# Patient Record
Sex: Female | Born: 1985 | Race: White | Hispanic: No | Marital: Single | Smoking: Current every day smoker
Health system: Southern US, Community
[De-identification: ages and names within clinical notes are randomized; demographics above are authoritative.]

## PROBLEM LIST (undated history)

## (undated) DIAGNOSIS — R87629 Unspecified abnormal cytological findings in specimens from vagina: Secondary | ICD-10-CM

## (undated) HISTORY — PX: NO PAST SURGERIES: SHX2092

## (undated) HISTORY — DX: Unspecified abnormal cytological findings in specimens from vagina: R87.629

---
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
## 2007-02-20 ENCOUNTER — Other Ambulatory Visit: Admission: RE | Admit: 2007-02-20 | Discharge: 2007-02-20 | Payer: Self-pay | Admitting: Obstetrics and Gynecology

## 2007-08-04 ENCOUNTER — Inpatient Hospital Stay (HOSPITAL_COMMUNITY): Admission: AD | Admit: 2007-08-04 | Discharge: 2007-08-04 | Payer: Self-pay | Admitting: Obstetrics & Gynecology

## 2007-08-26 ENCOUNTER — Ambulatory Visit: Payer: Self-pay | Admitting: Advanced Practice Midwife

## 2007-08-26 ENCOUNTER — Inpatient Hospital Stay (HOSPITAL_COMMUNITY): Admission: AD | Admit: 2007-08-26 | Discharge: 2007-08-26 | Payer: Self-pay | Admitting: Family Medicine

## 2007-09-08 ENCOUNTER — Ambulatory Visit: Payer: Self-pay | Admitting: Obstetrics and Gynecology

## 2007-09-08 ENCOUNTER — Inpatient Hospital Stay (HOSPITAL_COMMUNITY): Admission: AD | Admit: 2007-09-08 | Discharge: 2007-09-08 | Payer: Self-pay | Admitting: Obstetrics & Gynecology

## 2007-09-23 ENCOUNTER — Inpatient Hospital Stay (HOSPITAL_COMMUNITY): Admission: AD | Admit: 2007-09-23 | Discharge: 2007-09-23 | Payer: Self-pay | Admitting: Obstetrics & Gynecology

## 2007-09-25 ENCOUNTER — Ambulatory Visit: Payer: Self-pay | Admitting: Family Medicine

## 2007-09-25 ENCOUNTER — Inpatient Hospital Stay (HOSPITAL_COMMUNITY): Admission: AD | Admit: 2007-09-25 | Discharge: 2007-09-25 | Payer: Self-pay | Admitting: Obstetrics & Gynecology

## 2007-09-27 ENCOUNTER — Ambulatory Visit: Payer: Self-pay | Admitting: Obstetrics & Gynecology

## 2007-09-27 ENCOUNTER — Inpatient Hospital Stay (HOSPITAL_COMMUNITY): Admission: AD | Admit: 2007-09-27 | Discharge: 2007-09-27 | Payer: Self-pay | Admitting: Obstetrics & Gynecology

## 2007-10-06 ENCOUNTER — Ambulatory Visit: Payer: Self-pay | Admitting: Obstetrics and Gynecology

## 2007-10-06 ENCOUNTER — Inpatient Hospital Stay (HOSPITAL_COMMUNITY): Admission: AD | Admit: 2007-10-06 | Discharge: 2007-10-07 | Payer: Self-pay | Admitting: Obstetrics and Gynecology

## 2009-02-06 ENCOUNTER — Emergency Department (HOSPITAL_COMMUNITY): Admission: EM | Admit: 2009-02-06 | Discharge: 2009-02-07 | Payer: Self-pay | Admitting: Emergency Medicine

## 2009-06-21 ENCOUNTER — Other Ambulatory Visit: Admission: RE | Admit: 2009-06-21 | Discharge: 2009-06-21 | Payer: Self-pay | Admitting: Unknown Physician Specialty

## 2010-02-12 ENCOUNTER — Encounter: Payer: Self-pay | Admitting: Pediatrics

## 2010-05-09 ENCOUNTER — Other Ambulatory Visit (HOSPITAL_COMMUNITY)
Admission: RE | Admit: 2010-05-09 | Discharge: 2010-05-09 | Disposition: A | Discharge status: Home / Self Care | Payer: Self-pay | Source: Ambulatory Visit | Attending: Unknown Physician Specialty | Admitting: Unknown Physician Specialty

## 2010-05-09 ENCOUNTER — Other Ambulatory Visit (HOSPITAL_COMMUNITY)
Admission: RE | Admit: 2010-05-09 | Discharge: 2010-05-09 | Disposition: A | Discharge status: Home / Self Care | Payer: Self-pay | Source: Ambulatory Visit | Attending: Nurse Practitioner | Admitting: Nurse Practitioner

## 2010-05-09 ENCOUNTER — Other Ambulatory Visit: Payer: Self-pay | Admitting: Nurse Practitioner

## 2010-05-09 DIAGNOSIS — R87610 Atypical squamous cells of undetermined significance on cytologic smear of cervix (ASC-US): Secondary | ICD-10-CM | POA: Insufficient documentation

## 2010-05-09 DIAGNOSIS — D069 Carcinoma in situ of cervix, unspecified: Secondary | ICD-10-CM | POA: Insufficient documentation

## 2010-06-09 NOTE — Op Note (Signed)
NAMEALUEL, SCHWARZ                ACCOUNT NO.:  [id number]   MEDICAL RECORD NO.:  [id number]          PATIENT TYPE:  INP   LOCATION:  A412                          FACILITY:  [facility]   PHYSICIAN:  Tilda Burrow, M.D. DATE OF BIRTH:  01/24/1985   DATE OF PROCEDURE:  04/05/2006  DATE OF DISCHARGE:                               OPERATIVE REPORT   Stacy [name] progressed slowly in labor, began to develop an excellent  contraction pattern after Pitocin was begun at about 8 a.m.  She had  spontaneous rupture mid morning.  Her cervix went from 3 cm to complete  in less than half hour.  She pushed twice and delivered over an intact  peritoneum, a healthy female infant, Apgars 9 and 9, weight is still  pending.  The amniotic fluid was clear.  There was no nuchal cord.  The  baby showed excellent respiratory activity.  The delivery was attended  by two family members.  Mother delivered under IV analgesic, did not  have an epidural, did not have any lacerations, and placenta delivered  easily, three vessel cord, cord blood gases are pending at the time of  this dictation.  Estimated blood loss 250 mL.      Tilda Burrow, M.D.  Electronically Signed     JVF/MEDQ  D:  04/05/2006  T:  04/06/2006  Job:  [id number]   cc:   Juan Quam, M.D.  Fax: [phone]   Francoise Schaumann. Milford Cage DO, FAAP  Fax: [phone]

## 2010-06-09 NOTE — H&P (Signed)
Stacy [name], Stacy [name]                ACCOUNT NO.:  [id number]   MEDICAL RECORD NO.:  [id number]           PATIENT TYPE:  OBV   LOCATION:  LDR2                          FACILITY:  [facility]   PHYSICIAN:  Tilda Burrow, M.D. DATE OF BIRTH:  Aug 10, 1985   DATE OF ADMISSION:  03/04/2006  DATE OF DISCHARGE:  02/11/2008LH                              HISTORY & PHYSICAL   DATE OF OBSERVATION ADMISSION:  March 04, 2006.   DATE OF OBSERVATION DISCHARGE:  March 04, 2006.   REASON FOR ADMISSION:  Pregnancy at 33 weeks with sharp-shooting low  abdominal pain.   MEDICAL HISTORY:  Positive for hypoglycemia.   SURGICAL HISTORY:  Negative.   ALLERGIES:  SHE HAS NO KNOWN ALLERGIES.   PRENATAL COURSE:  Uneventful.  Blood type is A positive, UDS negative.  Rubella is immune.  Hepatitis B surface antigen is negative.  HIV is  negative. HSV is negative.  Serology is nonreactive.  Pap normal.  GC  and chlamydia negative.  AFP normal.  She is a previously positive GBH  status, 28 week hemoglobin 11.0, 28 week hematocrit 33.8, 1-hour glucose  108.   PHYSICAL EXAM:  VITAL SIGNS: Stable.  Cervix is firm, closed, posterior and high.  Fetal heart rate pattern is  reactive on the strip but no uterine contractions noted x2 hours.   PLAN:  We are going to discharge her home to follow up at the office  p.r.n.      Zerita Boers, Lanier Clam      Tilda Burrow, M.D.  Electronically Signed    DL/MEDQ  D:  46/96/2952  T:  03/04/2006  Job:  [id number]

## 2010-06-09 NOTE — Group Therapy Note (Signed)
NAMEGEORJEAN, TOYA                ACCOUNT NO.:  [id number]   MEDICAL RECORD NO.:  [id number]          PATIENT TYPE:  OIB   LOCATION:  LDR2                          FACILITY:  [facility]   PHYSICIAN:  Tilda Burrow, M.D. DATE OF BIRTH:  1985-02-10   DATE OF PROCEDURE:  04/02/2006  DATE OF DISCHARGE:  04/02/2006                                 PROGRESS NOTE   Genavive came in last night about 2100 with complaints of questionable  rupture of membranes.  She said she had a gush of fluid come out x1.  She did have the same complaint about 2 weeks ago with a subsequent  negative sterile speculum exam.  She was held overnight for observation.  She did not have any more leaking.  She had an AFI done downstairs.  I  do not have the exact number but it normal per patient.   Sterile speculum exam was performed normal-appearing physiological fluid  no spurting of fluid with Valsalva.  Nitrazine negative and fern  negative.  Cervix--the outer os is about 4 cm.  Her cervix is thick; and  the inner office is 2 cm at -2 station vertex presentation. At the  present time we are putting the patient on the monitor to obtain a  reactive nonstress test before sending her home.      Jacklyn Shell, C.N.M.      Tilda Burrow, M.D.  Electronically Signed    FC/MEDQ  D:  04/02/2006  T:  04/02/2006  Job:  [id number]

## 2010-06-09 NOTE — H&P (Signed)
NAMEMAKALYN, LENNOX                ACCOUNT NO.:  [id number]   MEDICAL RECORD NO.:  [id number]          PATIENT TYPE:  INP   LOCATION:  LDR1                           FACILITY:   PHYSICIAN:  Tilda Burrow, M.D. DATE OF BIRTH:  [birth year]   DATE OF ADMISSION:  04/05/2006  DATE OF DISCHARGE:  LH                              HISTORY & PHYSICAL   REASON FOR ADMISSION:  Pregnancy at 38 weeks, elective induction at the  request of both patient and significant other.   PAST MEDICAL HISTORY:  Positive for hypoglycemia.   PAST SURGICAL HISTORY:  Negative.   ALLERGIES:  No known drug allergies.   MEDICATIONS:  Prenatal vitamins.   FAMILY HISTORY:  Benign.   PRENATAL COURSE:  Uneventful up to this point. Blood type is A positive.  VDS negative. Rubella immune. Hepatitis B surface antigen negative. HIV  is negative. HSV is negative. Serology is nonreactive. Pap normal.  Repeat GC/Chlamydia negative. AFP normal. GBS is a prior positive, she  will need treatment in labor.  A 28-week hemoglobin is 11, 28 week hematocrit 33.8. One hour glucose  was 101.   PHYSICAL EXAMINATION:  VITAL SIGNS: Weight is 169, blood pressure  126/64. Fundal height is 37 cm. Fetal heart rate is 130, strong and  regular.  PELVIC: Cervix is 2 cm, about 60% effaced, -2 station.   After a lengthy discussion with Robertha Marcelino Duster and her significant  other, they opt for elective induction due to the fact he is to ship out  to [place] at the end of the week and would like to see his infant son.  I  discussed with them thoroughly the risk of induction at 38 weeks, the  risk of premature infant having respiratory distress syndrome; prolonged  hospital stay, threat to the infant's well being due to elective  induction. Also discussed the increased risk  of cesarean section with both of them.  They both verbalized their  understanding of this and still desire elective induction.  She is to  present to the hospital on  Thursday evening for Foley bulb induction of  labor.  I discussed plan of care with Dr. Emelda Fear and he is in  agreement with plan of care.      Zerita Boers, Lanier Clam      Tilda Burrow, M.D.  Electronically Signed    DL/MEDQ  D:  16/10/9602  T:  04/04/2006  Job:  [id number]   cc:   Francoise Schaumann. Milford Cage DO, FAAP  Fax: [phone]

## 2010-06-09 NOTE — Consult Note (Signed)
Stacy [name], Stacy [name]                ACCOUNT NO.:  [id number]   MEDICAL RECORD NO.:  [id number]          PATIENT TYPE:  OIB   LOCATION:  LDR2                          FACILITY:  [facility]   PHYSICIAN:  Lazaro Arms, M.D.   DATE OF BIRTH:  Mar 31, 1985   DATE OF CONSULTATION:  03/30/2006  DATE OF DISCHARGE:  03/30/2006                                 CONSULTATION   LABOR AND DELIVERY OBSERVATION NOTE:  Stacy [name] is an 25 year old white  female gravida 2 para 1 estimated date of delivery April 17, 2006 at 29-  1/[redacted] weeks gestation who presented to labor and delivery complaining of  regular uterine contractions.  She was kept on the monitor for 2-1/2  hours.  She was 2 cm  when she came in and had not had any change over  that time.  She is contracting about every 10-20 minutes irregularly.  No rupture of membranes.  Fetal heart rate tracing is reactive, no  bleeding.   As a result, she is rule out labor and she appears to be false labor, so  she is discharged to home to follow up as scheduled or if labor  intensifies.      Lazaro Arms, M.D.  Electronically Signed     LHE/MEDQ  D:  03/30/2006  T:  03/30/2006  Job:  [id number]

## 2010-06-09 NOTE — H&P (Signed)
NAMENGUYEN, BUTLER                ACCOUNT NO.:  [id number]   MEDICAL RECORD NO.:  [id number]          PATIENT TYPE:  OIB   LOCATION:  LDR2                          FACILITY:  [facility]   PHYSICIAN:  Tilda Burrow, M.D. DATE OF BIRTH:  1985/01/24   DATE OF ADMISSION:  04/01/2006  DATE OF DISCHARGE:  03/11/2008LH                              HISTORY & PHYSICAL   REASON FOR ADMISSION:  Pregnancy at 38 weeks with questionable leaking  of membranes.  Raye notified the office last night of feeling wet and  concerned that her water might be leaking.  She was advised to come to  the hospital.  During the course of the night there has been no active  leaking noted, no uterine contractions.  Fetal heart rate is strong and  regular and not having any problems.   MEDICAL HISTORY:  Positive for hypoglycemia.   SURGICAL HISTORY:  Negative.   ALLERGIES:  NO KNOWN ALLERGIES.   LABORATORY DATA:  Blood type is A+, UDS is negative, Rubella is immune,  Hepatitis B surface is negative, HIV is negative, HSV is negative.  Serology is nonreactive, PAP normal, GC and chlamydia are negative.  She  is prior GBS positive.  hemoglobin 11, hematocrit 33.8, one-hour glucose  is 108.   PHYSICAL EXAMINATION:  Vital signs are stable.  There has been no active  leaking.  Fetal heart rate is stable.   PLAN:  We are going do a sterile spec exam and an ultrasound for AFI.      Zerita Boers, Lanier Clam      Tilda Burrow, M.D.  Electronically Signed    DL/MEDQ  D:  57/84/6962  T:  04/02/2006  Job:  [id number]   cc:   Tilda Burrow, M.D.  Fax: [phone]

## 2010-06-09 NOTE — Op Note (Signed)
NAMEIZABELA, OW                ACCOUNT NO.:  [id number]   MEDICAL RECORD NO.:  [id number]          PATIENT TYPE:  INP   LOCATION:  LDR2                          FACILITY:  [facility]   PHYSICIAN:  Tilda Burrow, M.D. DATE OF BIRTH:  14-Jun-1985   DATE OF PROCEDURE:  DATE OF DISCHARGE:                                  PROCEDURE NOTE   DELIVERY SUMMARY   ONSET OF LABOR:  0800 August 09, 2004.   DATE OF DELIVERY:  1637 August 09, 2004.   LENGTH OF FIRST STAGE LABOR:  8 hours 15 minutes.   LENGTH OF SECOND STAGE LABOR:  22 minutes.   LENGTH OF THIRD STAGE LABOR:  3 minutes  .   Patient had a normal spontaneous vaginal delivery of a viable female infant.  Apgars 9 and 9 over an intact perineum upon delivery of head.  Shoulders  were spontaneously rotated without any difficulty and infant easily  delivered.  Upon delivery infant had strong cry, good movement of all  extremities and pinked up well.  Cord blood gas was obtained.  Third stage  of labor was actually managed with 20 units Pitocin in 1000 cc of D5-LR at a  rapid rate.  Estimated blood loss approximately 300 cc Hemabate 0.5 amp IM  was given prophylactically due to tendency for uterine relaxation.  Placenta  was delivered spontaneously via Schultze mechanism.  A three-vessel cord was  noted upon inspection, and membranes were noted to be intact __________ .       DL/MEDQ  D:  16/10/9602  T:  08/09/2004  Job:  [id number]

## 2010-06-09 NOTE — H&P (Signed)
Stacy [name], Stacy [name]                ACCOUNT NO.:  [id number]   MEDICAL RECORD NO.:  [id number]          PATIENT TYPE:  OIB   LOCATION:  LDR2                          FACILITY:  [facility]   PHYSICIAN:  Lazaro Arms, M.D.   DATE OF BIRTH:  01/11/1986   DATE OF ADMISSION:  DATE OF DISCHARGE:  LH                                HISTORY & PHYSICAL   HISTORY OF PRESENT ILLNESS:  Stacy [name] is a 25 year old white female gravida  1, para 0, estimated date of delivery of August 23, 2004 at 37-6/[redacted] weeks  gestation who came into labor and delivery last night complaining of regular  uterine contractions. She was having contractions every 2 to 4 minutes and  was 1 cm.  We kept her overnight for observation and this morning her cervix  is 3 to 4 cm, 80% effaced and minus 1 to minus 2 station vertex.  Amniotomy  was performed and reveals clear fluid.  Prenatal care has been somewhat  spotty and she had an almost 2 month lapse in care from April to June and  she is also significant for having a restraining order placed on the father  of the baby but those issues seem to have been cleared up.   PAST MEDICAL HISTORY:  Negative.   PAST SURGICAL HISTORY:  Negative.   OBSTETRICAL HISTORY:  She is nulliparous.   ALLERGIES:  None.   MEDICATIONS:  Prenatal vitamins and iron.   LABORATORY DATA:  Blood type is 0 positive.  She was positive on drug screen  for marijuana.  Rubella is immune.  Hepatitis B was negative.  HIV was  nonreactive.  Serology was nonreactive X2.  Pap smear was normal.  GC and  Chlamydia negative x2.  Her Group B Strep is positive.  I do not have a  Glucola reported on my prenatal record.  We will track that down.   PHYSICAL EXAMINATION:  HEENT:  Unremarkable.  NECK:  Thyroid is normal.  LUNGS:  Clear.  HEART:  Regular rate and rhythm without murmurs, rubs or gallops.  BREASTS:  Without masses, discharge or skin changes.  ABDOMEN:  Last fundal height in office was 37 cm.  PELVIC:   Cervix as above 3-4 cm.  80% effaced, minus 1 to 2 station, vertex,  soft, mid plane.  Amniotomy was performed with clear fluid.  EXTREMITIES:  Warm with no edema.  NEUROLOGICAL:  Grossly intact.   IMPRESSION:  1.  Intrauterine pregnancy at 37-6/[redacted] weeks gestation.  2.  Early labor.   PLAN:  Patient is admitted for labor management with expectation of NSVD.       LHE/MEDQ  D:  08/09/2004  T:  08/09/2004  Job:  [id number]

## 2010-06-09 NOTE — Op Note (Signed)
[name], [name]                ACCOUNT NO.:  [id number]   MEDICAL RECORD NO.:  [id number]          PATIENT TYPE:  OIB   LOCATION:  A415                          FACILITY:  [facility]   PHYSICIAN:  Stacy [name], M.D. DATE OF BIRTH:  1985/06/11   DATE OF PROCEDURE:  DATE OF DISCHARGE:                                 OPERATIVE REPORT   CHIEF COMPLAINT:  Low back pain, right greater than left.   A 25 year old female, approximately [redacted] weeks gestation, presents to labor  and delivery with third day of low back pain, worse today.  The patient has  had no bleeding, spotting, discharge, abnormalities.  She is afebrile.   Urinalysis was 2+ leukocytes, negative protein.   PHYSICAL EXAMINATION:  GENERAL:  A pale Caucasian female in late pregnancy  gestation.  Pretibial edema 2+.  VITAL SIGNS:  Blood pressures are within normal limits by verbal report.  The last blood pressure noted at 140/83.  MUSCULOSKELETAL:  Exam shows 2+ reflexes.  Negative CVA tenderness.  No  suprapubic tenderness.  PELVIC:  Cervical exam by RN shows cervix to be essentially closed.   PLAN:  Treat for possible UTI (patient refuses cathed UA, so we will treat  her for possible UTI, follow-up culture and sensitivity next week).   Observation x1 hour.       JVF/MEDQ  D:  07/20/2004  T:  07/20/2004  Job:  [id number]

## 2010-10-19 LAB — WET PREP, GENITAL: Yeast Wet Prep HPF POC: NONE SEEN

## 2010-10-25 LAB — RPR: RPR Ser Ql: NONREACTIVE

## 2010-10-25 LAB — CBC
HCT: 27.2 — ABNORMAL LOW
MCV: 81.4
Platelets: 205
RBC: 3.34 — ABNORMAL LOW
WBC: 8.2

## 2013-12-18 ENCOUNTER — Emergency Department (HOSPITAL_BASED_OUTPATIENT_CLINIC_OR_DEPARTMENT_OTHER): Payer: Self-pay

## 2013-12-18 ENCOUNTER — Emergency Department (HOSPITAL_BASED_OUTPATIENT_CLINIC_OR_DEPARTMENT_OTHER)
Admission: EM | Admit: 2013-12-18 | Discharge: 2013-12-18 | Disposition: A | Discharge status: Home / Self Care | Payer: Self-pay | Attending: Emergency Medicine | Admitting: Emergency Medicine

## 2013-12-18 ENCOUNTER — Encounter (HOSPITAL_BASED_OUTPATIENT_CLINIC_OR_DEPARTMENT_OTHER): Payer: Self-pay

## 2013-12-18 DIAGNOSIS — M25551 Pain in right hip: Secondary | ICD-10-CM | POA: Insufficient documentation

## 2013-12-18 DIAGNOSIS — R52 Pain, unspecified: Secondary | ICD-10-CM

## 2013-12-18 DIAGNOSIS — Z720 Tobacco use: Secondary | ICD-10-CM | POA: Insufficient documentation

## 2013-12-18 DIAGNOSIS — M5441 Lumbago with sciatica, right side: Secondary | ICD-10-CM | POA: Insufficient documentation

## 2013-12-18 MED ORDER — CYCLOBENZAPRINE HCL 10 MG PO TABS
5.0000 mg | ORAL_TABLET | Freq: Once | ORAL | Status: AC
  Administered 2013-12-18: 5 mg via ORAL
  Filled 2013-12-18: qty 1

## 2013-12-18 MED ORDER — IBUPROFEN 800 MG PO TABS
800.0000 mg | ORAL_TABLET | Freq: Once | ORAL | Status: AC
  Administered 2013-12-18: 800 mg via ORAL
  Filled 2013-12-18: qty 1

## 2013-12-18 MED ORDER — OXYCODONE-ACETAMINOPHEN 5-325 MG PO TABS
2.0000 | ORAL_TABLET | Freq: Once | ORAL | Status: AC
  Administered 2013-12-18: 2 via ORAL
  Filled 2013-12-18: qty 2

## 2013-12-18 NOTE — ED Notes (Signed)
C/o right lower back, right hip and thigh pain x .5 weeks-denies injury

## 2013-12-18 NOTE — Discharge Instructions (Signed)
Please follow up with your doctor Monday if not better.

## 2013-12-18 NOTE — ED Provider Notes (Signed)
CSN: [id number]     Arrival date & time 12/18/13  1526 History  This chart was scribed for Hilario Quarryanielle S Sahej Hauswirth, MD by Julian HyMorgan Graham, ED Scribe. The patient was seen in MHT13/MHT13. The patient's care was started at 7:46 PM.    Chief Complaint  Patient presents with  . Back Pain    Patient is a 28 y.o. female presenting with back pain. The history is provided by the patient. No language interpreter was used.  Back Pain Location:  Gluteal region Quality:  Unable to specify Radiates to:  R thigh Pain severity:  Moderate Pain is:  Same all the time Onset quality:  Gradual Duration:  3 days Timing:  Constant Progression:  Worsening Chronicity:  New Context: not falling, not recent illness and not recent injury   Relieved by:  Nothing Worsened by:  Ambulation, lying down, sitting, touching, standing and movement Ineffective treatments:  Ibuprofen Associated symptoms: no abdominal pain, no dysuria, no fever, no numbness, no tingling and no weakness    HPI Comments: Stacy Long is a 28 y.o. female who presents to the Emergency Department complaining of new, moderate, constant, and gradually worsening lower back pain onset 3 day ago. Pt has associated right leg pain localizing to the right thigh and groin. She denies fall, trauma or injury to the area. Pt states she has needed assistance to ambulate. She attempted to alleviate her pain with tylenol with no relief. She has also attempted to stretch with no relief. Her pain is worsened with ROM, ambulation, laying down and sitting. She denies taking medications regularly. Pt denies allergies. Pt is a smoker. Pt denies numbness, weakness, fevers, dysuria, frequency, urgency or hematuria.   LMP: November 6-7 which was a normal period at a normal time.  History reviewed. No pertinent past medical history. History reviewed. No pertinent past surgical history. No family history on file. History  Substance Use Topics  . Smoking status: Current Every  Day Smoker  . Smokeless tobacco: Not on file  . Alcohol Use: No   OB History    No data available     Review of Systems  Constitutional: Negative for fever and chills.  Gastrointestinal: Negative for nausea, vomiting and abdominal pain.  Genitourinary: Negative for dysuria, urgency, frequency and hematuria.  Musculoskeletal: Positive for back pain.  Neurological: Negative for tingling, weakness and numbness.  All other systems reviewed and are negative.  Allergies  Review of patient's allergies indicates no known allergies.  Home Medications   Prior to Admission medications   Not on File   Triage Vitals: BP 106/57 mmHg  Pulse 71  Temp(Src) 98.2 F (36.8 C) (Oral)  Resp 16  Ht 5\' 5"  (1.651 m)  Wt 130 lb (58.968 kg)  BMI 21.63 kg/m2  SpO2 100%  LMP 12/07/2013  Physical Exam  Constitutional: She is oriented to person, place, and time. She appears well-developed and well-nourished.  HENT:  Head: Normocephalic and atraumatic.  Right Ear: External ear normal.  Left Ear: External ear normal.  Nose: Nose normal.  Mouth/Throat: Oropharynx is clear and moist.  Eyes: Conjunctivae and EOM are normal. Pupils are equal, round, and reactive to light.  Neck: Normal range of motion. Neck supple.  Cardiovascular: Normal rate, regular rhythm, normal heart sounds and intact distal pulses.   Pulmonary/Chest: Effort normal and breath sounds normal.  Abdominal: Soft. Bowel sounds are normal.  Musculoskeletal: Normal range of motion.  Neurological: She is alert and oriented to person, place, and time. She  has normal reflexes. She displays normal reflexes. No cranial nerve deficit. She exhibits normal muscle tone. Coordination normal.  Normal ambulation  Skin: Skin is warm and dry.  Psychiatric: She has a normal mood and affect. Her behavior is normal. Judgment and thought content normal.  Nursing note and vitals reviewed.   ED Course  Procedures (including critical care  time) DIAGNOSTIC STUDIES: Oxygen Saturation is 100% on RA, normal by my interpretation.    COORDINATION OF CARE: 7:51 PM- X-Addie Alonge of lower back and right leg. Patient informed of current plan for treatment and evaluation and agrees with plan at this time.  Imaging Review Dg Lumbar Spine Complete  12/18/2013   CLINICAL DATA:  Right lateral back pain, right hip pain for 1 week, no known injury  EXAM: LUMBAR SPINE - COMPLETE 4+ VIEW  COMPARISON:  None.  FINDINGS: Five views of lumbar spine submitted. No acute fracture or subluxation. Alignment, disc spaces and vertebral body heights are preserved.  IMPRESSION: No acute fracture or subluxation.  Normal alignment.   Electronically Signed   By: Natasha MeadLiviu  Pop M.D.   On: 12/18/2013 20:34   Dg Hip Complete Right  12/18/2013   CLINICAL DATA:  Right lateral back pain, right hip pain for 1 week, no known injury  EXAM: RIGHT HIP - COMPLETE 2+ VIEW  COMPARISON:  None.  FINDINGS: Three views of the right hip submitted. No acute fracture or subluxation. Bilateral hip joints are symmetrical in appearance.  IMPRESSION: Negative.   Electronically Signed   By: Natasha MeadLiviu  Pop M.D.   On: 12/18/2013 20:33    MDM   Final diagnoses:  Pain  Right-sided low back pain with right-sided sciatica  Right hip pain    I personally performed the services described in this documentation, which was scribed in my presence. The recorded information has been reviewed and considered.      Hilario Quarryanielle S Tephanie Escorcia, MD 12/18/13 [phone]

## 2014-07-30 ENCOUNTER — Encounter (HOSPITAL_COMMUNITY): Payer: Self-pay

## 2014-07-30 ENCOUNTER — Emergency Department (HOSPITAL_COMMUNITY)
Admission: EM | Admit: 2014-07-30 | Discharge: 2014-07-30 | Disposition: A | Discharge status: Home / Self Care | Payer: Self-pay | Attending: Emergency Medicine | Admitting: Emergency Medicine

## 2014-07-30 ENCOUNTER — Emergency Department (HOSPITAL_COMMUNITY): Payer: Self-pay

## 2014-07-30 DIAGNOSIS — Y9389 Activity, other specified: Secondary | ICD-10-CM | POA: Insufficient documentation

## 2014-07-30 DIAGNOSIS — Z720 Tobacco use: Secondary | ICD-10-CM | POA: Insufficient documentation

## 2014-07-30 DIAGNOSIS — S0511XA Contusion of eyeball and orbital tissues, right eye, initial encounter: Secondary | ICD-10-CM

## 2014-07-30 DIAGNOSIS — S60222A Contusion of left hand, initial encounter: Secondary | ICD-10-CM | POA: Insufficient documentation

## 2014-07-30 DIAGNOSIS — Y998 Other external cause status: Secondary | ICD-10-CM | POA: Insufficient documentation

## 2014-07-30 DIAGNOSIS — S0990XA Unspecified injury of head, initial encounter: Secondary | ICD-10-CM | POA: Insufficient documentation

## 2014-07-30 DIAGNOSIS — S0011XA Contusion of right eyelid and periocular area, initial encounter: Secondary | ICD-10-CM | POA: Insufficient documentation

## 2014-07-30 DIAGNOSIS — Y929 Unspecified place or not applicable: Secondary | ICD-10-CM | POA: Insufficient documentation

## 2014-07-30 DIAGNOSIS — W109XXA Fall (on) (from) unspecified stairs and steps, initial encounter: Secondary | ICD-10-CM | POA: Insufficient documentation

## 2014-07-30 MED ORDER — IBUPROFEN 400 MG PO TABS
600.0000 mg | ORAL_TABLET | Freq: Once | ORAL | Status: AC
  Administered 2014-07-30: 600 mg via ORAL
  Filled 2014-07-30: qty 2

## 2014-07-30 NOTE — Discharge Instructions (Signed)
Contusion A contusion is a deep bruise. Contusions are the result of an injury that caused bleeding under the skin. The contusion may turn blue, purple, or yellow. Minor injuries will give you a painless contusion, but more severe contusions may stay painful and swollen for a few weeks.  CAUSES  A contusion is usually caused by a blow, trauma, or direct force to an area of the body. SYMPTOMS   Swelling and redness of the injured area.  Bruising of the injured area.  Tenderness and soreness of the injured area.  Pain. DIAGNOSIS  The diagnosis can be made by taking a history and physical exam. An X-ray, CT scan, or MRI may be needed to determine if there were any associated injuries, such as fractures. TREATMENT  Specific treatment will depend on what area of the body was injured. In general, the best treatment for a contusion is resting, icing, elevating, and applying cold compresses to the injured area. Over-the-counter medicines may also be recommended for pain control. Ask your caregiver what the best treatment is for your contusion. HOME CARE INSTRUCTIONS   Put ice on the injured area.  Put ice in a plastic bag.  Place a towel between your skin and the bag.  Leave the ice on for 15-20 minutes, 3-4 times a day, or as directed by your health care provider.  Only take over-the-counter or prescription medicines for pain, discomfort, or fever as directed by your caregiver. Your caregiver may recommend avoiding anti-inflammatory medicines (aspirin, ibuprofen, and naproxen) for 48 hours because these medicines may increase bruising.  Rest the injured area.  If possible, elevate the injured area to reduce swelling. SEEK IMMEDIATE MEDICAL CARE IF:   You have increased bruising or swelling.  You have pain that is getting worse.  Your swelling or pain is not relieved with medicines. MAKE SURE YOU:   Understand these instructions.  Will watch your condition.  Will get help right  away if you are not doing well or get worse. Document Released: 10/18/2004 Document Revised: 01/13/2013 Document Reviewed: 11/13/2010 [facility] Patient Information 2015 [employer], [place]. This information is not intended to replace advice given to you by your health care provider. Make sure you discuss any questions you have with your health care provider.   You have had a head injury which does not appear to require admission at this time. A concussion is a state of changed mental ability from trauma.  SEEK IMMEDIATE MEDICAL ATTENTION IF: There is confusion or drowsiness (although children frequently become drowsy after injury).  You cannot awaken the injured person.  There is nausea (feeling sick to your stomach) or continued, forceful vomiting.  You notice dizziness or unsteadiness which is getting worse, or inability to walk.  You have convulsions or unconsciousness.  You experience severe, persistent headaches not relieved by Tylenol. (Do not take aspirin as this impairs clotting abilities). Take other pain medications only as directed.  You cannot use arms or legs normally.  There are changes in pupil sizes. (This is the black center in the colored part of the eye)  There is clear or bloody discharge from the nose or ears.  Change in speech, vision, swallowing, or understanding.  Localized weakness, numbness, tingling, or change in bowel or bladder control.
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
## 2014-07-30 NOTE — ED Notes (Signed)
Pt fell down approx 4 steps this morning, states she injured her left hand and has swelling to below right eye.  Pt denies loc

## 2014-12-29 ENCOUNTER — Encounter (HOSPITAL_COMMUNITY): Payer: Self-pay | Admitting: Emergency Medicine

## 2014-12-29 ENCOUNTER — Emergency Department (HOSPITAL_COMMUNITY)
Admission: EM | Admit: 2014-12-29 | Discharge: 2014-12-30 | Disposition: A | Discharge status: Home / Self Care | Payer: Self-pay | Attending: Emergency Medicine | Admitting: Emergency Medicine

## 2014-12-29 DIAGNOSIS — W25XXXA Contact with sharp glass, initial encounter: Secondary | ICD-10-CM | POA: Insufficient documentation

## 2014-12-29 DIAGNOSIS — R000 Tachycardia, unspecified: Secondary | ICD-10-CM | POA: Insufficient documentation

## 2014-12-29 DIAGNOSIS — Y9389 Activity, other specified: Secondary | ICD-10-CM | POA: Insufficient documentation

## 2014-12-29 DIAGNOSIS — F17200 Nicotine dependence, unspecified, uncomplicated: Secondary | ICD-10-CM | POA: Insufficient documentation

## 2014-12-29 DIAGNOSIS — Z23 Encounter for immunization: Secondary | ICD-10-CM | POA: Insufficient documentation

## 2014-12-29 DIAGNOSIS — Y998 Other external cause status: Secondary | ICD-10-CM | POA: Insufficient documentation

## 2014-12-29 DIAGNOSIS — Y9289 Other specified places as the place of occurrence of the external cause: Secondary | ICD-10-CM | POA: Insufficient documentation

## 2014-12-29 DIAGNOSIS — S91312A Laceration without foreign body, left foot, initial encounter: Secondary | ICD-10-CM | POA: Insufficient documentation
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
## 2014-12-30 MED ORDER — POVIDONE-IODINE 10 % EX SOLN
CUTANEOUS | Status: AC
  Administered 2014-12-30: 01:00:00
  Filled 2014-12-30: qty 118

## 2014-12-30 MED ORDER — TETANUS-DIPHTH-ACELL PERTUSSIS 5-2.5-18.5 LF-MCG/0.5 IM SUSP
0.5000 mL | Freq: Once | INTRAMUSCULAR | Status: AC
  Administered 2014-12-30: 0.5 mL via INTRAMUSCULAR
  Filled 2014-12-30: qty 0.5

## 2014-12-30 MED ORDER — LIDOCAINE HCL (PF) 1 % IJ SOLN
5.0000 mL | Freq: Once | INTRAMUSCULAR | Status: AC
  Administered 2014-12-30: 5 mL
  Filled 2014-12-30: qty 5

## 2014-12-30 MED ORDER — BACITRACIN-NEOMYCIN-POLYMYXIN 400-5-5000 EX OINT
TOPICAL_OINTMENT | Freq: Once | CUTANEOUS | Status: AC
  Administered 2014-12-30: 1 via TOPICAL
  Filled 2014-12-30: qty 1

## 2014-12-30 NOTE — Discharge Instructions (Signed)
Follow up in 7 days for suture removal with your doctor. Return here as needed for any signs of infection.

## 2014-12-30 NOTE — ED Notes (Signed)
Non stick dressing applied to patients left foot

## 2015-02-10 ENCOUNTER — Emergency Department (HOSPITAL_COMMUNITY)
Admission: EM | Admit: 2015-02-10 | Discharge: 2015-02-10 | Disposition: A | Discharge status: Home / Self Care | Payer: Self-pay | Attending: Emergency Medicine | Admitting: Emergency Medicine

## 2015-02-10 ENCOUNTER — Encounter (HOSPITAL_COMMUNITY): Payer: Self-pay | Admitting: Emergency Medicine

## 2015-02-10 DIAGNOSIS — F17200 Nicotine dependence, unspecified, uncomplicated: Secondary | ICD-10-CM | POA: Insufficient documentation

## 2015-02-10 DIAGNOSIS — K047 Periapical abscess without sinus: Secondary | ICD-10-CM | POA: Insufficient documentation

## 2015-02-10 DIAGNOSIS — K029 Dental caries, unspecified: Secondary | ICD-10-CM | POA: Insufficient documentation

## 2015-02-10 MED ORDER — AMOXICILLIN 500 MG PO CAPS: 500.0000 mg | ORAL_CAPSULE | Freq: Three times a day (TID) | ORAL | Status: AC

## 2015-02-10 MED ORDER — HYDROCODONE-ACETAMINOPHEN 5-325 MG PO TABS: 1.0000 | ORAL_TABLET | ORAL | Status: DC | PRN

## 2015-02-10 MED ORDER — HYDROCODONE-ACETAMINOPHEN 5-325 MG PO TABS
1.0000 | ORAL_TABLET | Freq: Once | ORAL | Status: AC
  Administered 2015-02-10: 1 via ORAL
  Filled 2015-02-10: qty 1

## 2015-02-10 MED ORDER — AMOXICILLIN 250 MG PO CAPS
500.0000 mg | ORAL_CAPSULE | Freq: Once | ORAL | Status: AC
  Administered 2015-02-10: 500 mg via ORAL
  Filled 2015-02-10: qty 2

## 2015-02-10 NOTE — Discharge Instructions (Signed)
Dental Abscess °A dental abscess is a collection of pus in or around a tooth. °CAUSES °This condition is caused by a bacterial infection around the root of the tooth that involves the inner part of the tooth (pulp). It may result from: °· Severe tooth decay. °· Trauma to the tooth that allows bacteria to enter into the pulp, such as a broken or chipped tooth. °· Severe gum disease around a tooth. °SYMPTOMS °Symptoms of this condition include: °· Severe pain in and around the infected tooth. °· Swelling and redness around the infected tooth, in the mouth, or in the face. °· Tenderness. °· Pus drainage. °· Bad breath. °· Bitter taste in the mouth. °· Difficulty swallowing. °· Difficulty opening the mouth. °· Nausea. °· Vomiting. °· Chills. °· Swollen neck glands. °· Fever. °DIAGNOSIS °This condition is diagnosed with examination of the infected tooth. During the exam, your dentist may tap on the infected tooth. Your dentist will also ask about your medical and dental history and may order X-rays. °TREATMENT °This condition is treated by eliminating the infection. This may be done with: °· Antibiotic medicine. °· A root canal. This may be performed to save the tooth. °· Pulling (extracting) the tooth. This may also involve draining the abscess. This is done if the tooth cannot be saved. °HOME CARE INSTRUCTIONS °· Take medicines only as directed by your dentist. °· If you were prescribed antibiotic medicine, finish all of it even if you start to feel better. °· Rinse your mouth (gargle) often with salt water to relieve pain or swelling. °· Do not drive or operate heavy machinery while taking pain medicine. °· Do not apply heat to the outside of your mouth. °· Keep all follow-up visits as directed by your dentist. This is important. °SEEK MEDICAL CARE IF: °· Your pain is worse and is not helped by medicine. °SEEK IMMEDIATE MEDICAL CARE IF: °· You have a fever or chills. °· Your symptoms suddenly get worse. °· You have a  very bad headache. °· You have problems breathing or swallowing. °· You have trouble opening your mouth. °· You have swelling in your neck or around your eye. °  °This information is not intended to replace advice given to you by your health care provider. Make sure you discuss any questions you have with your health care provider. °  °Document Released: 01/08/2005 Document Revised: 05/25/2014 Document Reviewed: 01/05/2014 °Elsevier Interactive Patient Education ©2016 [employer] ° ° °Complete your entire course of antibiotics as prescribed.  You  may use the hydrocodone for pain relief but do not drive within 4 hours of taking as this will make you drowsy.  Avoid applying heat or ice to this abscess area which can worsen your symptoms.  You may use warm salt water swish and spit treatment or half peroxide and water swish and spit after meals to keep this area clean as discussed.  Call the dentist listed above for further management of your symptoms. ° ° ° °

## 2015-02-10 NOTE — ED Provider Notes (Signed)
CSN: [id number]     Arrival date & time 02/10/15  1350 History   First MD Initiated Contact with Patient 02/10/15 1439     Chief Complaint  Patient presents with  . Oral Swelling     (Consider location/radiation/quality/duration/timing/severity/associated sxs/prior Treatment) The history is provided by the patient.   Stacy Long is a 30 y.o. female presenting with a 2 day history of dental pain and gingival swelling.   The patient has a history of injury and/or decay in the tooth involved which has recently started to cause increased  pain.  There has been no fevers, chills, nausea or vomiting, also no complaint of difficulty swallowing, although chewing makes pain worse.  The patient has tried orajel without relief of symptoms.        History reviewed. No pertinent past medical history. History reviewed. No pertinent past surgical history. History reviewed. No pertinent family history. Social History  Substance Use Topics  . Smoking status: Current Every Day Smoker -- 1.00 packs/day  . Smokeless tobacco: None  . Alcohol Use: No   OB History    No data available     Review of Systems  Constitutional: Negative for fever and chills.  HENT: Positive for dental problem and facial swelling. Negative for sore throat.   Respiratory: Negative for shortness of breath.   Musculoskeletal: Negative for neck pain and neck stiffness.      Allergies  Codeine and Pseudoeph-doxylamine-dm-apap  Home Medications   Prior to Admission medications   Medication Sig Start Date End Date Taking? Authorizing Provider  benzocaine (ORAJEL) 10 % mucosal gel Use as directed 1 application in the mouth or throat as needed for mouth pain.   Yes Historical Provider, MD  Iron-Vitamins (GERITOL COMPLETE PO) Take 1 tablet by mouth daily.   Yes Historical Provider, MD  amoxicillin (AMOXIL) 500 MG capsule Take 1 capsule (500 mg total) by mouth 3 (three) times daily. 02/10/15 02/20/15  Burgess Amor, PA-C    HYDROcodone-acetaminophen (NORCO/VICODIN) 5-325 MG tablet Take 1 tablet by mouth every 4 (four) hours as needed. 02/10/15   Burgess Amor, PA-C   BP 119/75 mmHg  Pulse 87  Temp(Src) 98.6 F (37 C) (Oral)  Resp 18  Ht  (1.651 m)  Wt 58.968 kg  BMI 21.63 kg/m2  SpO2 99%  LMP 01/23/2015 Physical Exam  Constitutional: She is oriented to person, place, and time. She appears well-developed and well-nourished. No distress.  HENT:  Head: Normocephalic and atraumatic.  Right Ear: Tympanic membrane and external ear normal.  Left Ear: Tympanic membrane and external ear normal.  Mouth/Throat: Oropharynx is clear and moist and mucous membranes are normal. No oral lesions. No trismus in the jaw. Dental abscesses present.    Generalized dental decay.  No purulent drainage.  Eyes: Conjunctivae are normal.  Neck: Normal range of motion. Neck supple.  Cardiovascular: Normal rate.   Pulmonary/Chest: Effort normal.  Lymphadenopathy:    She has no cervical adenopathy.  Neurological: She is alert and oriented to person, place, and time.  Skin: Skin is warm and dry. No erythema.  Psychiatric: She has a normal mood and affect.    ED Course  Procedures (including critical care time) Labs Review Labs Reviewed - No data to display  Imaging Review No results found. I have personally reviewed and evaluated these images and lab results as part of my medical decision-making.   EKG Interpretation None      MDM   Final diagnoses:  Dental  abscess    Patient with dental infection with moderate dental decay, no drainable abscess appreciated.  She was prescribed amoxicillin and hydrocodone.  First dose is given here, mother driving home.  Dental referrals given.  When necessary follow-up anticipated.  The patient appears reasonably screened and/or stabilized for discharge and I doubt any other medical condition or other [facility] requiring further screening, evaluation, or treatment in the ED at this  time prior to discharge.     Burgess Amor, PA-C 02/10/15 1557  Rolland Porter, MD 02/15/15 [phone]

## 2015-02-10 NOTE — ED Notes (Signed)
Pt states that she has been having left sided dental pain with swelling.

## 2016-01-12 IMAGING — DX DG HAND COMPLETE 3+V*L*
3 series · 3 of 3 positions shown · non-contrast
Comparison: None.

CLINICAL DATA: Left hand pain to the radial side and thumb. Fall
this morning. Initial encounter.

EXAM:
LEFT HAND - COMPLETE 3+ VIEW

[hand pa]
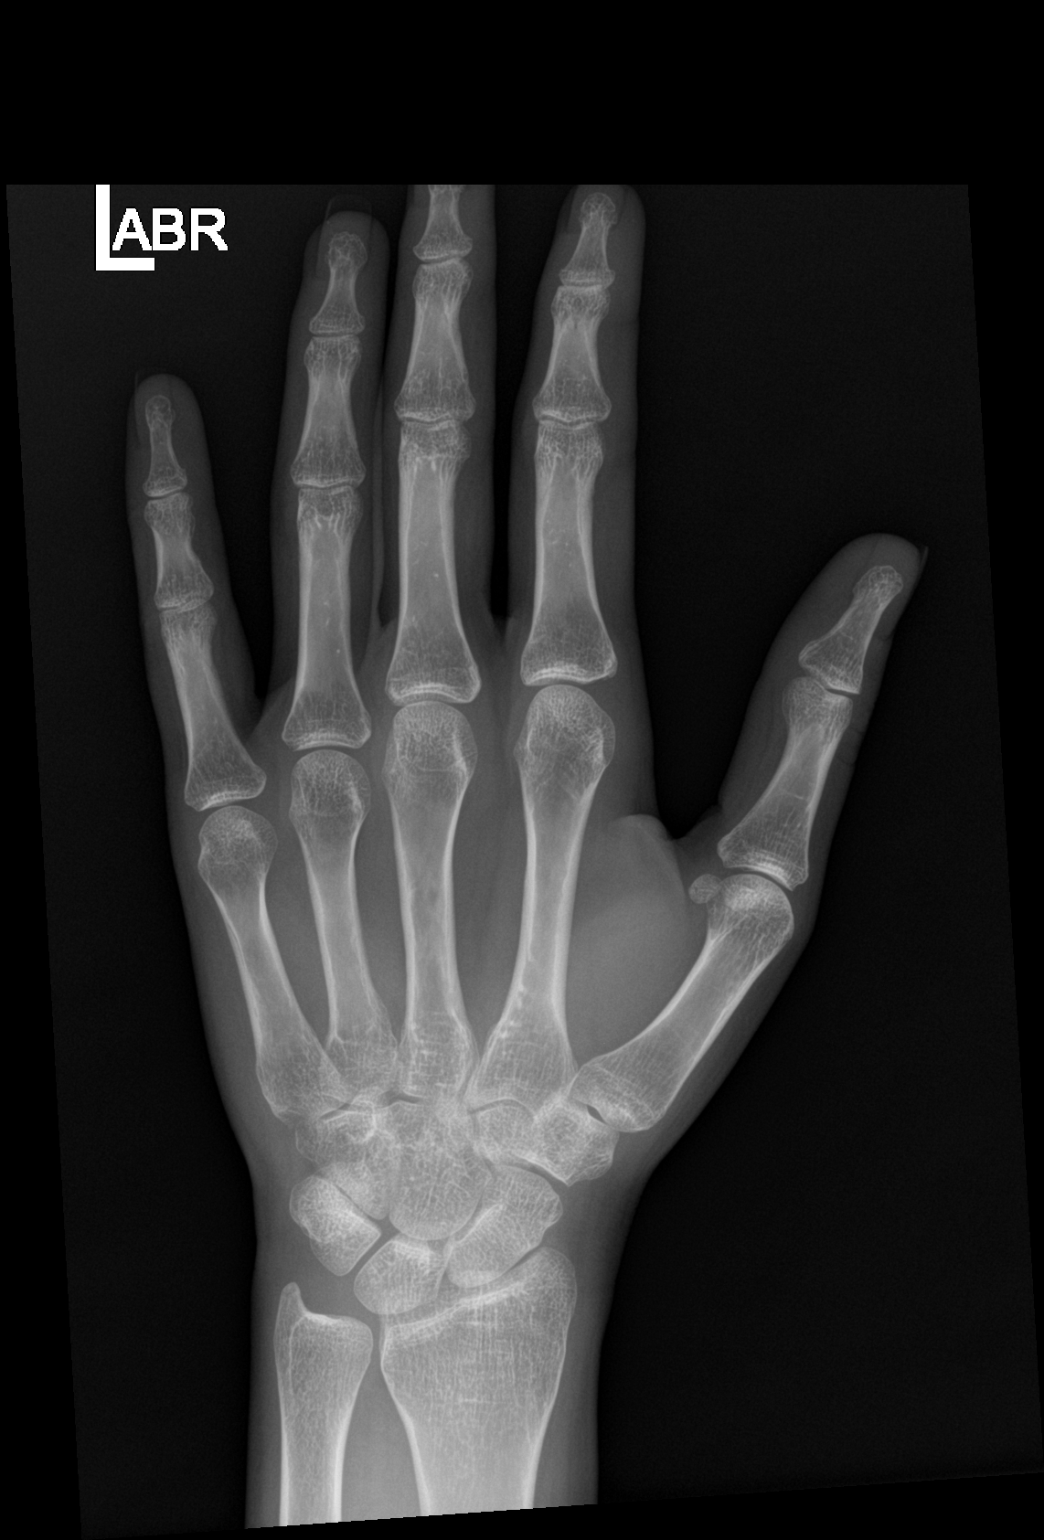

[hand obl]
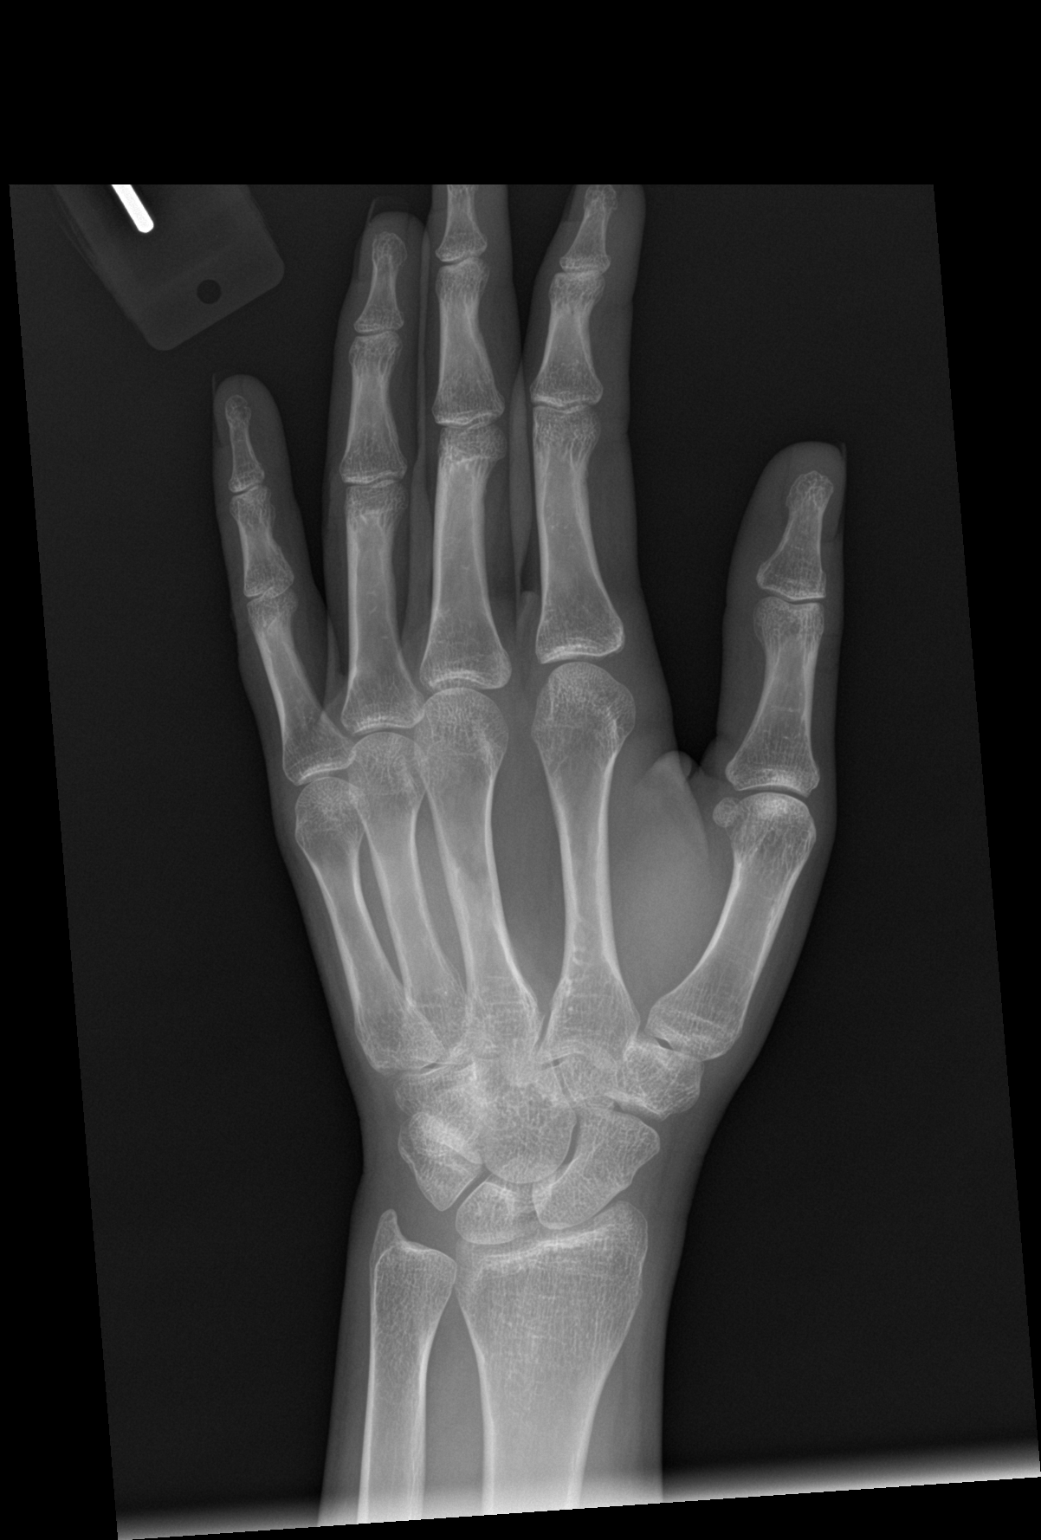

[hand lat]
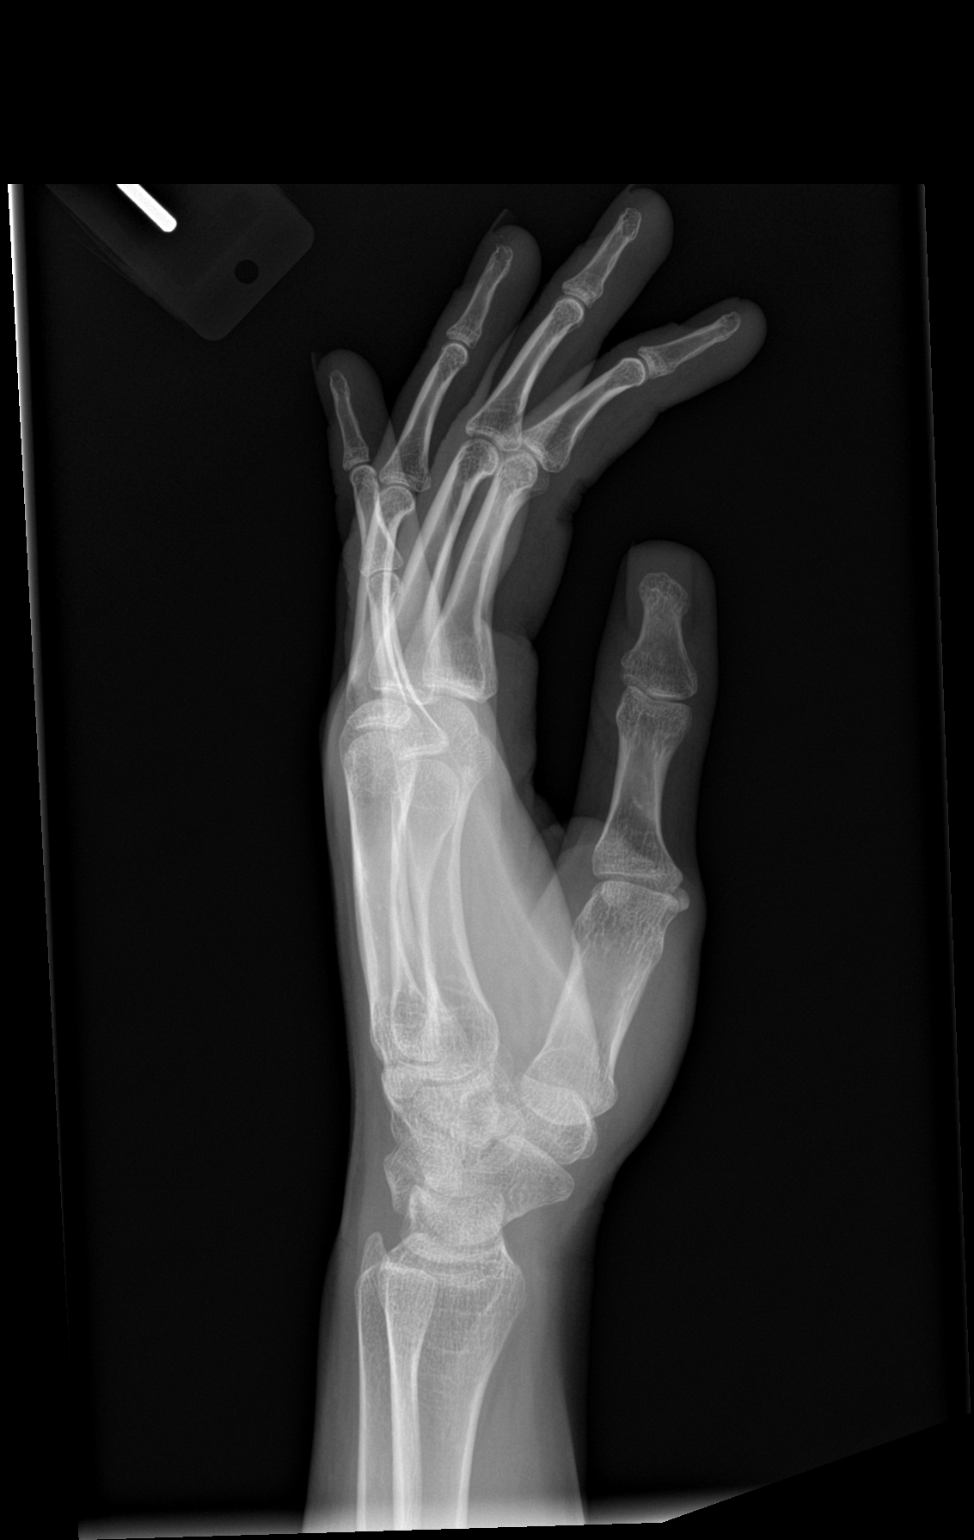

[3 of 3 positions shown; findings below may reference images not displayed]

FINDINGS: There is no evidence of fracture or dislocation. Soft tissues are
unremarkable.
IMPRESSION: Negative.

## 2016-06-21 ENCOUNTER — Emergency Department (HOSPITAL_COMMUNITY)
Admission: EM | Admit: 2016-06-21 | Discharge: 2016-06-21 | Disposition: A | Discharge status: Home / Self Care | Payer: Medicaid Other | Attending: Emergency Medicine | Admitting: Emergency Medicine

## 2016-06-21 ENCOUNTER — Encounter (HOSPITAL_COMMUNITY): Payer: Self-pay | Admitting: Emergency Medicine

## 2016-06-21 DIAGNOSIS — R220 Localized swelling, mass and lump, head: Secondary | ICD-10-CM | POA: Diagnosis present

## 2016-06-21 DIAGNOSIS — K029 Dental caries, unspecified: Secondary | ICD-10-CM | POA: Diagnosis not present

## 2016-06-21 DIAGNOSIS — F17200 Nicotine dependence, unspecified, uncomplicated: Secondary | ICD-10-CM | POA: Insufficient documentation

## 2016-06-21 DIAGNOSIS — K047 Periapical abscess without sinus: Secondary | ICD-10-CM | POA: Insufficient documentation

## 2016-06-21 MED ORDER — IBUPROFEN 600 MG PO TABS: 600.0000 mg | ORAL_TABLET | Freq: Four times a day (QID) | ORAL | 0 refills | Status: DC | PRN

## 2016-06-21 MED ORDER — AMOXICILLIN 500 MG PO CAPS: 500.0000 mg | ORAL_CAPSULE | Freq: Three times a day (TID) | ORAL | 0 refills | Status: AC

## 2016-06-21 MED ORDER — TRAMADOL HCL 50 MG PO TABS
50.0000 mg | ORAL_TABLET | Freq: Once | ORAL | Status: AC
  Administered 2016-06-21: 50 mg via ORAL
  Filled 2016-06-21: qty 1

## 2016-06-21 MED ORDER — TRAMADOL HCL 50 MG PO TABS: 50.0000 mg | ORAL_TABLET | Freq: Four times a day (QID) | ORAL | 0 refills | Status: DC | PRN

## 2016-06-21 MED ORDER — AMOXICILLIN 250 MG PO CAPS
500.0000 mg | ORAL_CAPSULE | Freq: Once | ORAL | Status: AC
  Administered 2016-06-21: 500 mg via ORAL
  Filled 2016-06-21: qty 2

## 2016-06-21 MED ORDER — IBUPROFEN 800 MG PO TABS
800.0000 mg | ORAL_TABLET | Freq: Once | ORAL | Status: AC
  Administered 2016-06-21: 800 mg via ORAL
  Filled 2016-06-21: qty 1

## 2016-06-21 NOTE — Discharge Instructions (Signed)
Complete your entire course of antibiotics as prescribed.  You  may use the tramadol for pain relief but do not drive within 4 hours of taking as this will make you drowsy.  Avoid applying heat or ice to this abscess area which can worsen your symptoms.  You may use warm salt water swish and spit treatment or half peroxide and water swish and spit after meals to keep this area clean as discussed.  Call your dentist for a recheck of your symptoms and definitive dental care as discussed.
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
## 2016-06-21 NOTE — ED Triage Notes (Signed)
Patient complains of right sided facial swelling that started 2 days ago. Patient states she may have tooth abscess. NAD

## 2016-07-03 ENCOUNTER — Encounter: Payer: Self-pay | Admitting: Adult Health

## 2016-07-03 ENCOUNTER — Ambulatory Visit (INDEPENDENT_AMBULATORY_CARE_PROVIDER_SITE_OTHER): Payer: Medicaid Other | Admitting: Adult Health

## 2016-07-03 VITALS — BP 112/68 | HR 96 | Ht 65.25 in | Wt 135.5 lb

## 2016-07-03 DIAGNOSIS — Z3490 Encounter for supervision of normal pregnancy, unspecified, unspecified trimester: Secondary | ICD-10-CM

## 2016-07-03 DIAGNOSIS — Z3201 Encounter for pregnancy test, result positive: Secondary | ICD-10-CM

## 2016-07-03 DIAGNOSIS — O3680X0 Pregnancy with inconclusive fetal viability, not applicable or unspecified: Secondary | ICD-10-CM

## 2016-07-03 LAB — POCT URINE PREGNANCY: PREG TEST UR: POSITIVE — AB

## 2016-07-03 NOTE — Patient Instructions (Signed)
First Trimester of Pregnancy The first trimester of pregnancy is from week 1 until the end of week 13 (months 1 through 3). A week after a sperm fertilizes an egg, the egg will implant on the wall of the uterus. This embryo will begin to develop into a baby. Genes from you and your partner will form the baby. The female genes will determine whether the baby will be a boy or a girl. At 6-8 weeks, the eyes and face will be formed, and the heartbeat can be seen on ultrasound. At the end of 12 weeks, all the baby's organs will be formed. Now that you are pregnant, you will want to do everything you can to have a healthy baby. Two of the most important things are to get good prenatal care and to follow your health care provider's instructions. Prenatal care is all the medical care you receive before the baby's birth. This care will help prevent, find, and treat any problems during the pregnancy and childbirth. Body changes during your first trimester Your body goes through many changes during pregnancy. The changes vary from woman to woman.  You may gain or lose a couple of pounds at first.  You may feel sick to your stomach (nauseous) and you may throw up (vomit). If the vomiting is uncontrollable, call your health care provider.  You may tire easily.  You may develop headaches that can be relieved by medicines. All medicines should be approved by your health care provider.  You may urinate more often. Painful urination may mean you have a bladder infection.  You may develop heartburn as a result of your pregnancy.  You may develop constipation because certain hormones are causing the muscles that push stool through your intestines to slow down.  You may develop hemorrhoids or swollen veins (varicose veins).  Your breasts may begin to grow larger and become tender. Your nipples may stick out more, and the tissue that surrounds them (areola) may become darker.  Your gums may bleed and may be  sensitive to brushing and flossing.  Dark spots or blotches (chloasma, mask of pregnancy) may develop on your face. This will likely fade after the baby is born.  Your menstrual periods will stop.  You may have a loss of appetite.  You may develop cravings for certain kinds of food.  You may have changes in your emotions from day to day, such as being excited to be pregnant or being concerned that something may go wrong with the pregnancy and baby.  You may have more vivid and strange dreams.  You may have changes in your hair. These can include thickening of your hair, rapid growth, and changes in texture. Some women also have hair loss during or after pregnancy, or hair that feels dry or thin. Your hair will most likely return to normal after your baby is born.  What to expect at prenatal visits During a routine prenatal visit:  You will be weighed to make sure you and the baby are growing normally.  Your blood pressure will be taken.  Your abdomen will be measured to track your baby's growth.  The fetal heartbeat will be listened to between weeks 10 and 14 of your pregnancy.  Test results from any previous visits will be discussed.  Your health care provider may ask you:  How you are feeling.  If you are feeling the baby move.  If you have had any abnormal symptoms, such as leaking fluid, bleeding, severe headaches,   or abdominal cramping.  If you are using any tobacco products, including cigarettes, chewing tobacco, and electronic cigarettes.  If you have any questions.  Other tests that may be performed during your first trimester include:  Blood tests to find your blood type and to check for the presence of any previous infections. The tests will also be used to check for low iron levels (anemia) and protein on red blood cells (Rh antibodies). Depending on your risk factors, or if you previously had diabetes during pregnancy, you may have tests to check for high blood  sugar that affects pregnant women (gestational diabetes).  Urine tests to check for infections, diabetes, or protein in the urine.  An ultrasound to confirm the proper growth and development of the baby.  Fetal screens for spinal cord problems (spina bifida) and Down syndrome.  HIV (human immunodeficiency virus) testing. Routine prenatal testing includes screening for HIV, unless you choose not to have this test.  You may need other tests to make sure you and the baby are doing well.  Follow these instructions at home: Medicines  Follow your health care provider's instructions regarding medicine use. Specific medicines may be either safe or unsafe to take during pregnancy.  Take a prenatal vitamin that contains at least 600 micrograms (mcg) of folic acid.  If you develop constipation, try taking a stool softener if your health care provider approves. Eating and drinking  Eat a balanced diet that includes fresh fruits and vegetables, whole grains, good sources of protein such as meat, eggs, or tofu, and low-fat dairy. Your health care provider will help you determine the amount of weight gain that is right for you.  Avoid raw meat and uncooked cheese. These carry germs that can cause birth defects in the baby.  Eating four or five small meals rather than three large meals a day may help relieve nausea and vomiting. If you start to feel nauseous, eating a few soda crackers can be helpful. Drinking liquids between meals, instead of during meals, also seems to help ease nausea and vomiting.  Limit foods that are high in fat and processed sugars, such as fried and sweet foods.  To prevent constipation: ? Eat foods that are high in fiber, such as fresh fruits and vegetables, whole grains, and beans. ? Drink enough fluid to keep your urine clear or pale yellow. Activity  Exercise only as directed by your health care provider. Most women can continue their usual exercise routine during  pregnancy. Try to exercise for 30 minutes at least 5 days a week. Exercising will help you: ? Control your weight. ? Stay in shape. ? Be prepared for labor and delivery.  Experiencing pain or cramping in the lower abdomen or lower back is a good sign that you should stop exercising. Check with your health care provider before continuing with normal exercises.  Try to avoid standing for long periods of time. Move your legs often if you must stand in one place for a long time.  Avoid heavy lifting.  Wear low-heeled shoes and practice good posture.  You may continue to have sex unless your health care provider tells you not to. Relieving pain and discomfort  Wear a good support bra to relieve breast tenderness.  Take warm sitz baths to soothe any pain or discomfort caused by hemorrhoids. Use hemorrhoid cream if your health care provider approves.  Rest with your legs elevated if you have leg cramps or low back pain.  If you develop   varicose veins in your legs, wear support hose. Elevate your feet for 15 minutes, 3-4 times a day. Limit salt in your diet. Prenatal care  Schedule your prenatal visits by the twelfth week of pregnancy. They are usually scheduled monthly at first, then more often in the last 2 months before delivery.  Write down your questions. Take them to your prenatal visits.  Keep all your prenatal visits as told by your health care provider. This is important. Safety  Wear your seat belt at all times when driving.  Make a list of emergency phone numbers, including numbers for family, friends, the hospital, and police and fire departments. General instructions  Ask your health care provider for a referral to a local prenatal education class. Begin classes no later than the beginning of month 6 of your pregnancy.  Ask for help if you have counseling or nutritional needs during pregnancy. Your health care provider can offer advice or refer you to specialists for help  with various needs.  Do not use hot tubs, steam rooms, or saunas.  Do not douche or use tampons or scented sanitary pads.  Do not cross your legs for long periods of time.  Avoid cat litter boxes and soil used by cats. These carry germs that can cause birth defects in the baby and possibly loss of the fetus by miscarriage or stillbirth.  Avoid all smoking, herbs, alcohol, and medicines not prescribed by your health care provider. Chemicals in these products affect the formation and growth of the baby.  Do not use any products that contain nicotine or tobacco, such as cigarettes and e-cigarettes. If you need help quitting, ask your health care provider. You may receive counseling support and other resources to help you quit.  Schedule a dentist appointment. At home, brush your teeth with a soft toothbrush and be gentle when you floss. Contact a health care provider if:  You have dizziness.  You have mild pelvic cramps, pelvic pressure, or nagging pain in the abdominal area.  You have persistent nausea, vomiting, or diarrhea.  You have a bad smelling vaginal discharge.  You have pain when you urinate.  You notice increased swelling in your face, hands, legs, or ankles.  You are exposed to fifth disease or chickenpox.  You are exposed to German measles (rubella) and have never had it. Get help right away if:  You have a fever.  You are leaking fluid from your vagina.  You have spotting or bleeding from your vagina.  You have severe abdominal cramping or pain.  You have rapid weight gain or loss.  You vomit blood or material that looks like coffee grounds.  You develop a severe headache.  You have shortness of breath.  You have any kind of trauma, such as from a fall or a car accident. Summary  The first trimester of pregnancy is from week 1 until the end of week 13 (months 1 through 3).  Your body goes through many changes during pregnancy. The changes vary from  woman to woman.  You will have routine prenatal visits. During those visits, your health care provider will examine you, discuss any test results you may have, and talk with you about how you are feeling. This information is not intended to replace advice given to you by your health care provider. Make sure you discuss any questions you have with your health care provider. Document Released: 01/02/2001 Document Revised: 12/21/2015 Document Reviewed: 12/21/2015 Elsevier Interactive Patient Education  2017 [employer]  

## 2016-07-03 NOTE — Progress Notes (Signed)
Subjective:     Patient ID: Stacy GentileCristin M Long, female   DOB: 12-Feb-1985, 31 y.o.   MRN: [id number]  HPI Stacy Long is a 31 year old white female in for UPT, has missed a period and had +HPT.   Review of Systems +missed period,with +HPT Reviewed past medical,surgical, social and family history. Reviewed medications and allergies.     Objective:   Physical Exam BP 112/68 (BP Location: Left Arm, Patient Position: Sitting, Cuff Size: Normal)   Pulse 96   Ht 5' 5.25" (1.657 m)   Wt 135 lb 8 oz (61.5 kg)   LMP 05/07/2016 (Exact Date)   BMI 22.38 kg/m UPT +, about 8+1 week by LMP with EDD 02/11/17,Skin warm and dry. Neck: mid line trachea, normal thyroid, good ROM, no lymphadenopathy noted. Lungs: clear to ausculation bilaterally. Cardiovascular: regular rate and rhythm.Abdomen is soft and non tender.PHQ 2 score 0.    Assessment:     1. Pregnancy examination or test, positive result   2. Pregnancy, unspecified gestational age   763. Encounter to determine fetal viability of pregnancy, single or unspecified fetus       Plan:     Return in 1 week for dating US Decrease smoking Review handout on first trimester and and [facility] handout

## 2016-07-10 ENCOUNTER — Other Ambulatory Visit: Payer: Medicaid Other

## 2016-07-18 ENCOUNTER — Ambulatory Visit (INDEPENDENT_AMBULATORY_CARE_PROVIDER_SITE_OTHER): Payer: Medicaid Other

## 2016-07-18 DIAGNOSIS — O3680X0 Pregnancy with inconclusive fetal viability, not applicable or unspecified: Secondary | ICD-10-CM | POA: Diagnosis not present

## 2016-07-18 NOTE — Progress Notes (Signed)
US 11+1 wks,single IUP,pos fht 162 bpm,normal ovaries bilat,crl 43.07 mm,EDD 02/05/2017

## 2016-08-06 ENCOUNTER — Ambulatory Visit (INDEPENDENT_AMBULATORY_CARE_PROVIDER_SITE_OTHER): Payer: Medicaid Other

## 2016-08-06 ENCOUNTER — Other Ambulatory Visit: Payer: Self-pay | Admitting: Obstetrics and Gynecology

## 2016-08-06 ENCOUNTER — Ambulatory Visit: Payer: Medicaid Other | Admitting: *Deleted

## 2016-08-06 ENCOUNTER — Other Ambulatory Visit (HOSPITAL_COMMUNITY)
Admission: RE | Admit: 2016-08-06 | Discharge: 2016-08-06 | Disposition: A | Discharge status: Home / Self Care | Payer: Medicaid Other | Source: Ambulatory Visit | Attending: Obstetrics & Gynecology | Admitting: Obstetrics & Gynecology

## 2016-08-06 ENCOUNTER — Encounter: Payer: Self-pay | Admitting: Women's Health

## 2016-08-06 ENCOUNTER — Ambulatory Visit (INDEPENDENT_AMBULATORY_CARE_PROVIDER_SITE_OTHER): Payer: Medicaid Other | Admitting: Women's Health

## 2016-08-06 VITALS — BP 120/80 | HR 72 | Wt 134.0 lb

## 2016-08-06 DIAGNOSIS — Z1379 Encounter for other screening for genetic and chromosomal anomalies: Secondary | ICD-10-CM

## 2016-08-06 DIAGNOSIS — Z3482 Encounter for supervision of other normal pregnancy, second trimester: Secondary | ICD-10-CM

## 2016-08-06 DIAGNOSIS — Z3682 Encounter for antenatal screening for nuchal translucency: Secondary | ICD-10-CM | POA: Diagnosis not present

## 2016-08-06 DIAGNOSIS — Z3481 Encounter for supervision of other normal pregnancy, first trimester: Secondary | ICD-10-CM | POA: Insufficient documentation

## 2016-08-06 DIAGNOSIS — O09891 Supervision of other high risk pregnancies, first trimester: Secondary | ICD-10-CM

## 2016-08-06 DIAGNOSIS — Z3A13 13 weeks gestation of pregnancy: Secondary | ICD-10-CM | POA: Diagnosis not present

## 2016-08-06 DIAGNOSIS — Z1389 Encounter for screening for other disorder: Secondary | ICD-10-CM

## 2016-08-06 DIAGNOSIS — Z720 Tobacco use: Secondary | ICD-10-CM | POA: Diagnosis not present

## 2016-08-06 DIAGNOSIS — Z363 Encounter for antenatal screening for malformations: Secondary | ICD-10-CM

## 2016-08-06 DIAGNOSIS — O99331 Smoking (tobacco) complicating pregnancy, first trimester: Secondary | ICD-10-CM

## 2016-08-06 DIAGNOSIS — F17200 Nicotine dependence, unspecified, uncomplicated: Secondary | ICD-10-CM

## 2016-08-06 DIAGNOSIS — Z3490 Encounter for supervision of normal pregnancy, unspecified, unspecified trimester: Secondary | ICD-10-CM | POA: Insufficient documentation

## 2016-08-06 DIAGNOSIS — Z331 Pregnant state, incidental: Secondary | ICD-10-CM

## 2016-08-06 LAB — POCT URINALYSIS DIPSTICK
Blood, UA: NEGATIVE
GLUCOSE UA: NEGATIVE
KETONES UA: NEGATIVE
Nitrite, UA: NEGATIVE
PROTEIN UA: NEGATIVE

## 2016-08-06 NOTE — Progress Notes (Signed)
US 13+6 wks,crl 72.3 mm,fhr 153 bpm,normal ovaries bilat,NB present,NT 1.9 mm,ant pl gr 0

## 2016-08-06 NOTE — Patient Instructions (Signed)
Nausea & Vomiting  Have saltine crackers or pretzels by your bed and eat a few bites before you raise your head out of bed in the morning  Eat small frequent meals throughout the day instead of large meals  Drink plenty of fluids throughout the day to stay hydrated, just don't drink a lot of fluids with your meals.  This can make your stomach fill up faster making you feel sick  Do not brush your teeth right after you eat  Products with real ginger are good for nausea, like ginger ale and ginger hard candy Make sure it says made with real ginger!  Sucking on sour candy like lemon heads is also good for nausea  If your prenatal vitamins make you nauseated, take them at night so you will sleep through the nausea  Sea Bands  If you feel like you need medicine for the nausea & vomiting please let us know  If you are unable to keep any fluids or food down please let us know   Constipation  Drink plenty of fluid, preferably water, throughout the day  Eat foods high in fiber such as fruits, vegetables, and grains  Exercise, such as walking, is a good way to keep your bowels regular  Drink warm fluids, especially warm prune juice, or decaf coffee  Eat a 1/2 cup of real oatmeal (not instant), 1/2 cup applesauce, and 1/2-1 cup warm prune juice every day  If needed, you may take Colace (docusate sodium) stool softener once or twice a day to help keep the stool soft. If you are pregnant, wait until you are out of your first trimester (12-14 weeks of pregnancy)  If you still are having problems with constipation, you may take Miralax once daily as needed to help keep your bowels regular.  If you are pregnant, wait until you are out of your first trimester (12-14 weeks of pregnancy)  Second Trimester of Pregnancy The second trimester is from week 14 through week 27 (months 4 through 6). The second trimester is often a time when you feel your best. Your body has adjusted to being pregnant,  and you begin to feel better physically. Usually, morning sickness has lessened or quit completely, you may have more energy, and you may have an increase in appetite. The second trimester is also a time when the fetus is growing rapidly. At the end of the sixth month, the fetus is about 9 inches long and weighs about 1 pounds. You will likely begin to feel the baby move (quickening) between 16 and 20 weeks of pregnancy. Body changes during your second trimester Your body continues to go through many changes during your second trimester. The changes vary from woman to woman.  Your weight will continue to increase. You will notice your lower abdomen bulging out.  You may begin to get stretch marks on your hips, abdomen, and breasts.  You may develop headaches that can be relieved by medicines. The medicines should be approved by your health care provider.  You may urinate more often because the fetus is pressing on your bladder.  You may develop or continue to have heartburn as a result of your pregnancy.  You may develop constipation because certain hormones are causing the muscles that push waste through your intestines to slow down.  You may develop hemorrhoids or swollen, bulging veins (varicose veins).  You may have back pain. This is caused by:  Weight gain.  Pregnancy hormones that are relaxing the joints   in your pelvis.  A shift in weight and the muscles that support your balance.  Your breasts will continue to grow and they will continue to become tender.  Your gums may bleed and may be sensitive to brushing and flossing.  Dark spots or blotches (chloasma, mask of pregnancy) may develop on your face. This will likely fade after the baby is born.  A dark line from your belly button to the pubic area (linea nigra) may appear. This will likely fade after the baby is born.  You may have changes in your hair. These can include thickening of your hair, rapid growth, and changes  in texture. Some women also have hair loss during or after pregnancy, or hair that feels dry or thin. Your hair will most likely return to normal after your baby is born. What to expect at prenatal visits During a routine prenatal visit:  You will be weighed to make sure you and the fetus are growing normally.  Your blood pressure will be taken.  Your abdomen will be measured to track your baby's growth.  The fetal heartbeat will be listened to.  Any test results from the previous visit will be discussed. Your health care provider may ask you:  How you are feeling.  If you are feeling the baby move.  If you have had any abnormal symptoms, such as leaking fluid, bleeding, severe headaches, or abdominal cramping.  If you are using any tobacco products, including cigarettes, chewing tobacco, and electronic cigarettes.  If you have any questions. Other tests that may be performed during your second trimester include:  Blood tests that check for:  Low iron levels (anemia).  High blood sugar that affects pregnant women (gestational diabetes) between 24 and 28 weeks.  Rh antibodies. This is to check for a protein on red blood cells (Rh factor).  Urine tests to check for infections, diabetes, or protein in the urine.  An ultrasound to confirm the proper growth and development of the baby.  An amniocentesis to check for possible genetic problems.  Fetal screens for spina bifida and Down syndrome.  HIV (human immunodeficiency virus) testing. Routine prenatal testing includes screening for HIV, unless you choose not to have this test. Follow these instructions at home: Medicines   Follow your health care provider's instructions regarding medicine use. Specific medicines may be either safe or unsafe to take during pregnancy.  Take a prenatal vitamin that contains at least 600 micrograms (mcg) of folic acid.  If you develop constipation, try taking a stool softener if your health  care provider approves. Eating and drinking   Eat a balanced diet that includes fresh fruits and vegetables, whole grains, good sources of protein such as meat, eggs, or tofu, and low-fat dairy. Your health care provider will help you determine the amount of weight gain that is right for you.  Avoid raw meat and uncooked cheese. These carry germs that can cause birth defects in the baby.  If you have low calcium intake from food, talk to your health care provider about whether you should take a daily calcium supplement.  Limit foods that are high in fat and processed sugars, such as fried and sweet foods.  To prevent constipation:  Drink enough fluid to keep your urine clear or pale yellow.  Eat foods that are high in fiber, such as fresh fruits and vegetables, whole grains, and beans. Activity   Exercise only as directed by your health care provider. Most women can continue   their usual exercise routine during pregnancy. Try to exercise for 30 minutes at least 5 days a week. Stop exercising if you experience uterine contractions.  Avoid heavy lifting, wear low heel shoes, and practice good posture.  A sexual relationship may be continued unless your health care provider directs you otherwise. Relieving pain and discomfort   Wear a good support bra to prevent discomfort from breast tenderness.  Take warm sitz baths to soothe any pain or discomfort caused by hemorrhoids. Use hemorrhoid cream if your health care provider approves.  Rest with your legs elevated if you have leg cramps or low back pain.  If you develop varicose veins, wear support hose. Elevate your feet for 15 minutes, 3-4 times a day. Limit salt in your diet. Prenatal Care   Write down your questions. Take them to your prenatal visits.  Keep all your prenatal visits as told by your health care provider. This is important. Safety   Wear your seat belt at all times when driving.  Make a list of emergency phone  numbers, including numbers for family, friends, the hospital, and police and fire departments. General instructions   Ask your health care provider for a referral to a local prenatal education class. Begin classes no later than the beginning of month 6 of your pregnancy.  Ask for help if you have counseling or nutritional needs during pregnancy. Your health care provider can offer advice or refer you to specialists for help with various needs.  Do not use hot tubs, steam rooms, or saunas.  Do not douche or use tampons or scented sanitary pads.  Do not cross your legs for long periods of time.  Avoid cat litter boxes and soil used by cats. These carry germs that can cause birth defects in the baby and possibly loss of the fetus by miscarriage or stillbirth.  Avoid all smoking, herbs, alcohol, and unprescribed drugs. Chemicals in these products can affect the formation and growth of the baby.  Do not use any products that contain nicotine or tobacco, such as cigarettes and e-cigarettes. If you need help quitting, ask your health care provider.  Visit your dentist if you have not gone yet during your pregnancy. Use a soft toothbrush to brush your teeth and be gentle when you floss. Contact a health care provider if:  You have dizziness.  You have mild pelvic cramps, pelvic pressure, or nagging pain in the abdominal area.  You have persistent nausea, vomiting, or diarrhea.  You have a bad smelling vaginal discharge.  You have pain when you urinate. Get help right away if:  You have a fever.  You are leaking fluid from your vagina.  You have spotting or bleeding from your vagina.  You have severe abdominal cramping or pain.  You have rapid weight gain or weight loss.  You have shortness of breath with chest pain.  You notice sudden or extreme swelling of your face, hands, ankles, feet, or legs.  You have not felt your baby move in over an hour.  You have severe headaches  that do not go away when you take medicine.  You have vision changes. Summary  The second trimester is from week 14 through week 27 (months 4 through 6). It is also a time when the fetus is growing rapidly.  Your body goes through many changes during pregnancy. The changes vary from woman to woman.  Avoid all smoking, herbs, alcohol, and unprescribed drugs. These chemicals affect the formation and growth   your baby.  Do not use any tobacco products, such as cigarettes, chewing tobacco, and e-cigarettes. If you need help quitting, ask your health care provider.  Contact your health care provider if you have any questions. Keep all prenatal visits as told by your health care provider. This is important. This information is not intended to replace advice given to you by your health care provider. Make sure you discuss any questions you have with your health care provider. Document Released: 01/02/2001 Document Revised: 06/16/2015 Document Reviewed: 03/11/2012 [employer] Interactive Patient Education  2017 [employer]  

## 2016-08-06 NOTE — Progress Notes (Signed)
  Subjective:  Stacy Long is a 31 y.o. 714P3003 Caucasian female at 3542w6d by 11wk u/s, being seen today for her first obstetrical visit.  Her obstetrical history is significant for term uncomplicated svb x 3; smoker: 1.5-2ppd prior to pregnancy, now down to 1ppd.  Pregnancy history fully reviewed. H/O abnormal pap 'long time ago'- never f/u.   Patient reports no complaints. Denies vb, cramping, uti s/s, abnormal/malodorous vag d/c, or vulvovaginal itching/irritation.  BP 120/80   Pulse 72   Wt 134 lb (60.8 kg)   LMP 05/07/2016 (Exact Date)   BMI 22.13 kg/m   HISTORY: OB History  Gravida Para Term Preterm AB Living  4 3 3     3   SAB TAB Ectopic Multiple Live Births          3    # Outcome Date GA Lbr Len/2nd Weight Sex Delivery Anes PTL Lv  4 Current           3 Term 10/06/07 4864w0d  7 lb 2 oz (3.232 kg) F Vag-Spont None N LIV  2 Term 04/05/06 5428w0d  6 lb 11 oz (3.033 kg) M Vag-Spont None N LIV  1 Term 08/09/04 7628w0d  6 lb 9 oz (2.977 kg) M Vag-Spont None N LIV     Past Medical History:  Diagnosis Date  . Vaginal Pap smear, abnormal    Past Surgical History:  Procedure Laterality Date  . NO PAST SURGERIES     Family History  Problem Relation Age of Onset  . Other Paternal Grandfather        heart issues  . Diabetes Paternal Grandmother   . Dementia Maternal Grandfather   . Other Mother        back problems due to MVA  . Heart disease Mother   . Epilepsy Sister   . Seizures Daughter        18 months only 1 time  . Suicidality Maternal Uncle     Exam   System:     General: Well developed & nourished, no acute distress   Skin: Warm & dry, normal coloration and turgor, no rashes   Neurologic: Alert & oriented, normal mood   Cardiovascular: Regular rate & rhythm   Respiratory: Effort & rate normal, LCTAB, acyanotic   Abdomen: Soft, non tender   Extremities: normal strength, tone   Pelvic Exam:    Perineum: Normal perineum   Vulva: Normal, no lesions   Vagina:  Normal mucosa, normal discharge   Cervix: Normal, bulbous, appears closed   Uterus: Normal size/shape/contour for GA   Thin prep pap smear obtained w/ reflex high risk HPV cotesting FHR: + via u/s   Assessment:   Pregnancy: [id number] Patient Active Problem List   Diagnosis Date Noted  . Supervision of normal pregnancy 08/06/2016  . Smoker 08/06/2016    3242w6d [id number] New OB visit Smoker H/O abnormal pap  Plan:  Initial labs obtained Continue prenatal vitamins Problem list reviewed and updated Reviewed n/v relief measures and warning s/s to report Reviewed recommended weight gain based on pre-gravid BMI Encouraged well-balanced diet Genetic Screening discussed Integrated Screen: requested Cystic fibrosis screening discussed declined Ultrasound discussed; fetal survey: requested Follow up in 4 weeks (@18wks ) for visit, anatomy u/s, and 2nd IT CCNC completed Smokes 1pp/day, advised cessation, discussed risks to fetus while pregnant, to infant pp, and to herself. Offered QuitlineNC, declined    Stacy Long, Stacy Long CNM, [facility]NP[facility] 08/06/2016 2:13 PM

## 2016-08-08 LAB — MICROSCOPIC EXAMINATION: Casts: NONE SEEN /lpf

## 2016-08-08 LAB — URINALYSIS, ROUTINE W REFLEX MICROSCOPIC
BILIRUBIN UA: NEGATIVE
Glucose, UA: NEGATIVE
Ketones, UA: NEGATIVE
Nitrite, UA: NEGATIVE
PH UA: 6.5 (ref 5.0–7.5)
Protein, UA: NEGATIVE
RBC UA: NEGATIVE
Specific Gravity, UA: 1.016 (ref 1.005–1.030)
Urobilinogen, Ur: 1 mg/dL (ref 0.2–1.0)

## 2016-08-08 LAB — RUBELLA SCREEN: Rubella Antibodies, IGG: 1.86 index (ref >0.99)

## 2016-08-08 LAB — ABO/RH: Rh Factor: POSITIVE

## 2016-08-08 LAB — ANTIBODY SCREEN: ANTIBODY SCREEN: NEGATIVE

## 2016-08-08 LAB — CBC
HEMOGLOBIN: 11.1 g/dL (ref 11.1–15.9)
Hematocrit: 33.6 % — ABNORMAL LOW (ref 34.0–46.6)
MCH: 28.9 pg (ref 26.6–33.0)
MCHC: 33 g/dL (ref 31.5–35.7)
MCV: 88 fL (ref 79–97)
Platelets: 195 10*3/uL (ref 150–379)
RBC: 3.84 x10E6/uL (ref 3.77–5.28)
RDW: 18.3 % — AB (ref 12.3–15.4)
WBC: 9.1 10*3/uL (ref 3.4–10.8)

## 2016-08-08 LAB — RPR: RPR Ser Ql: NONREACTIVE

## 2016-08-08 LAB — PLEASE NOTE

## 2016-08-08 LAB — HIV ANTIBODY (ROUTINE TESTING W REFLEX): HIV Screen 4th Generation wRfx: NONREACTIVE

## 2016-08-08 LAB — VARICELLA ZOSTER ANTIBODY, IGG: Varicella zoster IgG: 1409 index (ref >165)

## 2016-08-08 LAB — HEPATITIS B SURFACE ANTIGEN: HEP B S AG: NEGATIVE

## 2016-08-09 LAB — CYTOLOGY - PAP
CHLAMYDIA, DNA PROBE: NEGATIVE
Neisseria Gonorrhea: NEGATIVE

## 2016-08-10 ENCOUNTER — Encounter: Payer: Self-pay | Admitting: Women's Health

## 2016-08-10 DIAGNOSIS — A599 Trichomoniasis, unspecified: Secondary | ICD-10-CM | POA: Insufficient documentation

## 2016-08-14 ENCOUNTER — Telehealth: Payer: Self-pay | Admitting: *Deleted

## 2016-08-14 ENCOUNTER — Other Ambulatory Visit: Payer: Self-pay | Admitting: Women's Health

## 2016-08-14 ENCOUNTER — Encounter: Payer: Self-pay | Admitting: *Deleted

## 2016-08-14 DIAGNOSIS — A599 Trichomoniasis, unspecified: Secondary | ICD-10-CM

## 2016-08-14 DIAGNOSIS — Z3482 Encounter for supervision of other normal pregnancy, second trimester: Secondary | ICD-10-CM

## 2016-08-14 DIAGNOSIS — R87619 Unspecified abnormal cytological findings in specimens from cervix uteri: Secondary | ICD-10-CM | POA: Insufficient documentation

## 2016-08-14 LAB — MATERNAL SCREEN, INTEGRATED #1
Maternal Age at EDD: 29.7 yr
PAPP-A Value: 1319.1 ng/mL

## 2016-08-14 MED ORDER — METRONIDAZOLE 500 MG PO TABS: 500.0000 mg | ORAL_TABLET | Freq: Two times a day (BID) | ORAL | 0 refills | Status: DC

## 2016-08-14 NOTE — Telephone Encounter (Signed)
Informed patient of abnormal pap and need for colpo at next visit. States she has never had normal pap. Also informed of trichomonas infection and antibiotic sent to pharmacy. States she and partner are no longer together. Advised she still needed to take medication. Verbalized understanding.

## 2016-08-14 NOTE — Telephone Encounter (Signed)
Unable to leave message. Mychart message sent to call office.

## 2016-09-03 ENCOUNTER — Other Ambulatory Visit: Payer: Medicaid Other

## 2016-09-04 ENCOUNTER — Encounter: Payer: Medicaid Other | Admitting: Obstetrics & Gynecology

## 2016-09-04 ENCOUNTER — Encounter: Payer: Self-pay | Admitting: Obstetrics & Gynecology

## 2016-09-04 ENCOUNTER — Encounter: Payer: Medicaid Other | Admitting: Advanced Practice Midwife

## 2016-09-04 ENCOUNTER — Ambulatory Visit (INDEPENDENT_AMBULATORY_CARE_PROVIDER_SITE_OTHER): Payer: Medicaid Other

## 2016-09-04 VITALS — BP 80/50 | HR 78 | Wt 135.0 lb

## 2016-09-04 DIAGNOSIS — Z363 Encounter for antenatal screening for malformations: Secondary | ICD-10-CM | POA: Diagnosis not present

## 2016-09-04 DIAGNOSIS — Z3402 Encounter for supervision of normal first pregnancy, second trimester: Secondary | ICD-10-CM

## 2016-09-04 LAB — POCT URINALYSIS DIPSTICK
Blood, UA: NEGATIVE
GLUCOSE UA: NEGATIVE
Ketones, UA: NEGATIVE
LEUKOCYTES UA: NEGATIVE
NITRITE UA: NEGATIVE

## 2016-09-04 NOTE — Progress Notes (Signed)
US 18 wks,cephalic,ant pl gr 0,normal ovaries bilat,cx 3.4 cm,bilat choroid plexus cysts, left 11 x 4.5  mm,right 11.5 x 4.3 mm,fhr 147 bpm,svp of fluid 3.6 cm,EFW 216 g,anatomy complete

## 2016-09-13 ENCOUNTER — Encounter: Payer: Medicaid Other | Admitting: Obstetrics and Gynecology

## 2016-11-01 ENCOUNTER — Ambulatory Visit (INDEPENDENT_AMBULATORY_CARE_PROVIDER_SITE_OTHER): Payer: Medicaid Other | Admitting: Obstetrics and Gynecology

## 2016-11-01 VITALS — BP 118/70 | HR 80 | Wt 139.0 lb

## 2016-11-01 DIAGNOSIS — O98312 Other infections with a predominantly sexual mode of transmission complicating pregnancy, second trimester: Secondary | ICD-10-CM

## 2016-11-01 DIAGNOSIS — Z1389 Encounter for screening for other disorder: Secondary | ICD-10-CM

## 2016-11-01 DIAGNOSIS — O09893 Supervision of other high risk pregnancies, third trimester: Secondary | ICD-10-CM

## 2016-11-01 DIAGNOSIS — A599 Trichomoniasis, unspecified: Secondary | ICD-10-CM | POA: Diagnosis not present

## 2016-11-01 DIAGNOSIS — D069 Carcinoma in situ of cervix, unspecified: Secondary | ICD-10-CM

## 2016-11-01 DIAGNOSIS — Z3482 Encounter for supervision of other normal pregnancy, second trimester: Secondary | ICD-10-CM

## 2016-11-01 DIAGNOSIS — R87810 Cervical high risk human papillomavirus (HPV) DNA test positive: Secondary | ICD-10-CM

## 2016-11-01 DIAGNOSIS — Z3A26 26 weeks gestation of pregnancy: Secondary | ICD-10-CM

## 2016-11-01 DIAGNOSIS — K6282 Dysplasia of anus: Secondary | ICD-10-CM

## 2016-11-01 DIAGNOSIS — Z331 Pregnant state, incidental: Secondary | ICD-10-CM

## 2016-11-01 LAB — POCT URINALYSIS DIPSTICK
Blood, UA: NEGATIVE
Glucose, UA: NEGATIVE
Nitrite, UA: NEGATIVE
Protein, UA: NEGATIVE

## 2016-11-01 LAB — POCT WET PREP (WET MOUNT)

## 2016-11-01 NOTE — Progress Notes (Signed)
Stacy Long is a 31 y.o. female [phone]  Estimated Date of Delivery: 02/05/17 [facility] [redacted]w[redacted]d  Chief Complaint  Patient presents with  . Routine Prenatal Visit  . Colposcopy  ____  Patient has no complaints. Pt is here for possible colpo due to LSIL CIN1/HPV, few cells suggestive of a higher grade lesion, trichomonas (treated) on pap smear on 08/09/16.  Soc Hx ; partner is incarcerated. Patient reports good fetal movement,                          denies any bleeding , rupture of membranes,or regular contractions.  Blood pressure 118/70, pulse 80, weight 139 lb (63 kg), last menstrual period 05/07/2016.   Urine results:notable for trace ketones, 2+ leukocytes  refer to the ob flow sheet for FH and FHR, ,                          Physical Examination: General appearance - alert, well appearing, and in no distress                                      Abdomen - FH not done                                                        -FHR not done                                                         soft, nontender, nondistended, no masses or organomegaly                                      Pelvic - Pap done Wet Prep done: Multiparous Cervix with positive chadwick sign  Moderate d/c , proof of cure done. Wet prep + trichomonas KOH neg for whiff , neg for yeast.                                            Questions were answered. Assessment: LROB [id number] @ [redacted]w[redacted]d Estimated Date of Delivery: 02/05/17,  Trich not cured + bilateral choroid plexus cysts. For repeat u/s at 28 wk. Plan:  Continued routine obstetrical care, LROB, Prescribe Metronidazole po and Metrogel for pt     F/u in 4 weeks for LROB, F/u for Pap smear after Trich has been treated, and likely delay colpo til postpartum.   By signing my name below, I, Izna Ahmed, attest that this documentation has been prepared under the direction and in the presence of Tilda Burrow., MD. Electronically Signed: Redge Gainer, Medical Scribe.  11/01/16. 3:17 PM.  I personally performed the services described in this documentation, which was SCRIBED in my presence. The recorded information has been reviewed and considered accurate. It has been edited as necessary during review. Christin Bach  V, MD

## 2016-11-15 ENCOUNTER — Other Ambulatory Visit: Payer: Medicaid Other

## 2016-11-29 ENCOUNTER — Encounter: Payer: Self-pay | Admitting: Women's Health

## 2016-11-29 ENCOUNTER — Ambulatory Visit (INDEPENDENT_AMBULATORY_CARE_PROVIDER_SITE_OTHER): Payer: Medicaid Other | Admitting: Women's Health

## 2016-11-29 VITALS — BP 104/62 | HR 86 | Wt 144.0 lb

## 2016-11-29 DIAGNOSIS — Z3483 Encounter for supervision of other normal pregnancy, third trimester: Secondary | ICD-10-CM

## 2016-11-29 DIAGNOSIS — Z3A30 30 weeks gestation of pregnancy: Secondary | ICD-10-CM

## 2016-11-29 DIAGNOSIS — O3503X0 Maternal care for (suspected) central nervous system malformation or damage in fetus, choroid plexus cysts, not applicable or unspecified: Secondary | ICD-10-CM

## 2016-11-29 DIAGNOSIS — O350XX0 Maternal care for (suspected) central nervous system malformation in fetus, not applicable or unspecified: Secondary | ICD-10-CM

## 2016-11-29 DIAGNOSIS — Z09 Encounter for follow-up examination after completed treatment for conditions other than malignant neoplasm: Secondary | ICD-10-CM

## 2016-11-29 DIAGNOSIS — Z331 Pregnant state, incidental: Secondary | ICD-10-CM

## 2016-11-29 DIAGNOSIS — Z1389 Encounter for screening for other disorder: Secondary | ICD-10-CM

## 2016-11-29 DIAGNOSIS — Z8619 Personal history of other infectious and parasitic diseases: Secondary | ICD-10-CM | POA: Diagnosis not present

## 2016-11-29 DIAGNOSIS — O0933 Supervision of pregnancy with insufficient antenatal care, third trimester: Secondary | ICD-10-CM

## 2016-11-29 DIAGNOSIS — A599 Trichomoniasis, unspecified: Secondary | ICD-10-CM

## 2016-11-29 LAB — POCT URINALYSIS DIPSTICK
Glucose, UA: NEGATIVE
Ketones, UA: NEGATIVE
Leukocytes, UA: NEGATIVE
NITRITE UA: NEGATIVE
PROTEIN UA: NEGATIVE
RBC UA: NEGATIVE

## 2016-11-29 LAB — POCT WET PREP (WET MOUNT)
Clue Cells Wet Prep Whiff POC: NEGATIVE
TRICHOMONAS WET PREP HPF POC: ABSENT

## 2016-11-29 NOTE — Progress Notes (Signed)
LOW-RISK PREGNANCY VISIT Patient name: Stacy GentileCristin M Long MRN [id number]  Date of birth: 07-09-1985 Chief Complaint:   Routine Prenatal Visit  History of Present Illness:   Stacy Long is a 31 y.o. 194P3003 female at 3364w2d with an Estimated Date of Delivery: 02/05/17 being seen today for ongoing management of a low-risk pregnancy.  Today she reports no complaints. Retreated for trichomonas last appt, took all meds, no sex. No symptoms. Had abnormal pap LSIL/HPV, few cells suggest HSIL- colpo has not been done. Bilateral cp cysts on anatomy u/s she was unaware of. Never completed genetic screening. No care 13-26wks d/t work. Hasn't done PN2.  Contractions: Irritability. Vag. Bleeding: None.  Movement: Present. denies leaking of fluid. Review of Systems:   Pertinent items are noted in HPI Denies abnormal vaginal discharge w/ itching/odor/irritation, headaches, visual changes, shortness of breath, chest pain, abdominal pain, severe nausea/vomiting, or problems with urination or bowel movements unless otherwise stated above. Pertinent History Reviewed:  Reviewed past medical,surgical, social, obstetrical and family history.  Reviewed problem list, medications and allergies. Physical Assessment:   Vitals:   11/29/16 1014  BP: 104/62  Pulse: 86  Weight: 144 lb (65.3 kg)  Body mass index is 23.78 kg/m.        Physical Examination:   General appearance: Well appearing, and in no distress  Mental status: Alert, oriented to person, place, and time  Skin: Warm & dry  Cardiovascular: Normal heart rate noted  Respiratory: Normal respiratory effort, no distress  Abdomen: Soft, gravid, nontender  Pelvic: cx visually closed, small amt white nonodorous d/c         Extremities: Edema: Trace  Fetal Status: Fetal Heart Rate (bpm): 135 Fundal Height: 27 cm Movement: Present    Results for orders placed or performed in visit on 11/29/16 (from the past 24 hour(s))  POCT urinalysis dipstick   Collection  Time: 11/29/16 10:16 AM  Result Value Ref Range   Color, UA     Clarity, UA     Glucose, UA neg    Bilirubin, UA     Ketones, UA neg    Spec Grav, UA  1.010 - 1.025   Blood, UA neg    pH, UA  5.0 - 8.0   Protein, UA neg    Urobilinogen, UA  0.2 or 1.0 E.U./dL   Nitrite, UA neg    Leukocytes, UA Negative Negative  POCT Wet Prep Mellody Drown(Wet [place])   Collection Time: 11/29/16 10:53 AM  Result Value Ref Range   Source Wet Prep POC vaginal    WBC, Wet Prep HPF POC none    Bacteria Wet Prep HPF POC None (A) Few   BACTERIA WET PREP MORPHOLOGY POC     Clue Cells Wet Prep HPF POC None None   Clue Cells Wet Prep Whiff POC Negative Whiff    Yeast Wet Prep HPF POC None    KOH Wet Prep POC     Trichomonas Wet Prep HPF POC Absent Absent    Assessment & Plan:  1) Low-risk pregnancy G4P3003 at 4064w2d with an Estimated Date of Delivery: 02/05/17   2) Trichomonas POC, neg  3) Abnormal pap @ 13wks, LSIL/HPV, few cells suggestive of HSIL> needs colpo  4) Bilateral CP cysts on u/s> will repeat u/s  5) Uterine size < dates> will check efw/afi on upcoming u/s   Labs/procedures today: Declined flu shot. She was advised that the flu shot is recommended during pregnancy to help protect her and her  baby. We discussed that it is an inactivated vaccine-so side effects are minimal, it is considered safe to receive during any trimester, and pregnant women are at a higher risk of developing potential complications from the flu, including death. She was given printed information from the CDC regarding the flu shot and the flu.    Plan:  Discussed w/ LHE, return in 1wk for colpo, will also get repeat u/s and PN2 at that time  Reviewed: Preterm labor symptoms and general obstetric precautions including but not limited to vaginal bleeding, contractions, leaking of fluid and fetal movement were reviewed in detail with the patient. Recommended Tdap at HD/PCP per CDC guidelines.  All questions were answered  Follow-up:  Return in about 1 week (around 12/06/2016) for f/u u/s, pn2, and colpo w/ LHE.  Orders Placed This Encounter  Procedures  . US OB Follow Up  . POCT urinalysis dipstick  . POCT [facility] [place]   Marge DuncansBooker, Ronak Duquette Randall CNM, [facility]NP-BC 11/29/2016 10:54 AM

## 2016-11-29 NOTE — Patient Instructions (Addendum)
Stacy Long, I greatly value your feedback.  If you receive a survey following your visit with [place] today, we appreciate you taking the time to fill it out.  Thanks, [name], CNM, WHNP-BC   You will have your sugar test next visit.  Please do not eat or drink anything after midnight the night before you come, not even water.  You will be here for at least two hours.     Call the office [phone]) or go to [facility] if:  You begin to have strong, frequent contractions  Your water breaks.  Sometimes it is a big gush of fluid, sometimes it is just a trickle that keeps getting your panties wet or running down your legs  You have vaginal bleeding.  It is normal to have a small amount of spotting if your cervix was checked.   You don't feel your baby moving like normal.  If you don't, get you something to eat and drink and lay down and focus on feeling your baby move.  You should feel at least 10 movements in 2 hours.  If you don't, you should call the office or go to [facility].    Tdap Vaccine  It is recommended that you get the Tdap vaccine during the third trimester of EACH pregnancy to help protect your baby from getting pertussis (whooping cough)  27-36 weeks is the BEST time to do this so that you can pass the protection on to your baby. During pregnancy is better than after pregnancy, but if you are unable to get it during pregnancy it will be offered at the hospital.   You can get this vaccine at the health department or your family doctor  Everyone who will be around your baby should also be up-to-date on their vaccines. Adults (who are not pregnant) only need 1 dose of Tdap during adulthood.   If you feel like you are getting a cold:  Humidifier and saline nasal spray for nasal congestion  Regular robitussin, cough drops for cough  Warm salt water gargles for sore throat  Mucinex with lots of water to help you cough up the mucous in your chest if  needed  Drink plenty of fluids and stay hydrated!  Wash your hands frequently.  Call if you are not improving by 7-10 days.     Third Trimester of Pregnancy The third trimester is from week 29 through week 42, months 7 through 9. The third trimester is a time when the fetus is growing rapidly. At the end of the ninth month, the fetus is about 20 inches in length and weighs 6-10 pounds.  BODY CHANGES Your body goes through many changes during pregnancy. The changes vary from woman to woman.   Your weight will continue to increase. You can expect to gain 25-35 pounds (11-16 kg) by the end of the pregnancy.  You may begin to get stretch marks on your hips, abdomen, and breasts.  You may urinate more often because the fetus is moving lower into your pelvis and pressing on your bladder.  You may develop or continue to have heartburn as a result of your pregnancy.  You may develop constipation because certain hormones are causing the muscles that push waste through your intestines to slow down.  You may develop hemorrhoids or swollen, bulging veins (varicose veins).  You may have pelvic pain because of the weight gain and pregnancy hormones relaxing your joints between the bones in your pelvis. Backaches may  result from overexertion of the muscles supporting your posture.  You may have changes in your hair. These can include thickening of your hair, rapid growth, and changes in texture. Some women also have hair loss during or after pregnancy, or hair that feels dry or thin. Your hair will most likely return to normal after your baby is born.  Your breasts will continue to grow and be tender. A yellow discharge may leak from your breasts called colostrum.  Your belly button may stick out.  You may feel short of breath because of your expanding uterus.  You may notice the fetus "dropping," or moving lower in your abdomen.  You may have a bloody mucus discharge. This usually occurs a  few days to a week before labor begins.  Your cervix becomes thin and soft (effaced) near your due date. WHAT TO EXPECT AT YOUR PRENATAL EXAMS  You will have prenatal exams every 2 weeks until week 36. Then, you will have weekly prenatal exams. During a routine prenatal visit:  You will be weighed to make sure you and the fetus are growing normally.  Your blood pressure is taken.  Your abdomen will be measured to track your baby's growth.  The fetal heartbeat will be listened to.  Any test results from the previous visit will be discussed.  You may have a cervical check near your due date to see if you have effaced. At around 36 weeks, your caregiver will check your cervix. At the same time, your caregiver will also perform a test on the secretions of the vaginal tissue. This test is to determine if a type of bacteria, Group B streptococcus, is present. Your caregiver will explain this further. Your caregiver may ask you:  What your birth plan is.  How you are feeling.  If you are feeling the baby move.  If you have had any abnormal symptoms, such as leaking fluid, bleeding, severe headaches, or abdominal cramping.  If you have any questions. Other tests or screenings that may be performed during your third trimester include:  Blood tests that check for low iron levels (anemia).  Fetal testing to check the health, activity level, and growth of the fetus. Testing is done if you have certain medical conditions or if there are problems during the pregnancy. FALSE LABOR You may feel small, irregular contractions that eventually go away. These are called Braxton Hicks contractions, or false labor. Contractions may last for hours, days, or even weeks before true labor sets in. If contractions come at regular intervals, intensify, or become painful, it is best to be seen by your caregiver.  SIGNS OF LABOR   Menstrual-like cramps.  Contractions that are 5 minutes apart or  less.  Contractions that start on the top of the uterus and spread down to the lower abdomen and back.  A sense of increased pelvic pressure or back pain.  A watery or bloody mucus discharge that comes from the vagina. If you have any of these signs before the 37th week of pregnancy, call your caregiver right away. You need to go to the hospital to get checked immediately. HOME CARE INSTRUCTIONS   Avoid all smoking, herbs, alcohol, and unprescribed drugs. These chemicals affect the formation and growth of the baby.  Follow your caregiver's instructions regarding medicine use. There are medicines that are either safe or unsafe to take during pregnancy.  Exercise only as directed by your caregiver. Experiencing uterine cramps is a good sign to stop exercising.  Continue to eat regular, healthy meals.  Wear a good support bra for breast tenderness.  Do not use hot tubs, steam rooms, or saunas.  Wear your seat belt at all times when driving.  Avoid raw meat, uncooked cheese, cat litter boxes, and soil used by cats. These carry germs that can cause birth defects in the baby.  Take your prenatal vitamins.  Try taking a stool softener (if your caregiver approves) if you develop constipation. Eat more high-fiber foods, such as fresh vegetables or fruit and whole grains. Drink plenty of fluids to keep your urine clear or pale yellow.  Take warm sitz baths to soothe any pain or discomfort caused by hemorrhoids. Use hemorrhoid cream if your caregiver approves.  If you develop varicose veins, wear support hose. Elevate your feet for 15 minutes, 3-4 times a day. Limit salt in your diet.  Avoid heavy lifting, wear low heal shoes, and practice good posture.  Rest a lot with your legs elevated if you have leg cramps or low back pain.  Visit your dentist if you have not gone during your pregnancy. Use a soft toothbrush to brush your teeth and be gentle when you floss.  A sexual relationship  may be continued unless your caregiver directs you otherwise.  Do not travel far distances unless it is absolutely necessary and only with the approval of your caregiver.  Take prenatal classes to understand, practice, and ask questions about the labor and delivery.  Make a trial run to the hospital.  Pack your hospital bag.  Prepare the baby's nursery.  Continue to go to all your prenatal visits as directed by your caregiver. SEEK MEDICAL CARE IF:  You are unsure if you are in labor or if your water has broken.  You have dizziness.  You have mild pelvic cramps, pelvic pressure, or nagging pain in your abdominal area.  You have persistent nausea, vomiting, or diarrhea.  You have a bad smelling vaginal discharge.  You have pain with urination. SEEK IMMEDIATE MEDICAL CARE IF:   You have a fever.  You are leaking fluid from your vagina.  You have spotting or bleeding from your vagina.  You have severe abdominal cramping or pain.  You have rapid weight loss or gain.  You have shortness of breath with chest pain.  You notice sudden or extreme swelling of your face, hands, ankles, feet, or legs.  You have not felt your baby move in over an hour.  You have severe headaches that do not go away with medicine.  You have vision changes. Document Released: 01/02/2001 Document Revised: 01/13/2013 Document Reviewed: 03/11/2012 [facility] Patient Information 2015 [place], [place]. This information is not intended to replace advice given to you by your health care provider. Make sure you discuss any questions you have with your health care provider.  PROTECT YOURSELF & YOUR BABY FROM THE FLU! Because you are pregnant, we at [facility], along with the Centers for Disease Control (CDC), recommend that you receive the flu vaccine to protect yourself and your baby from the flu. The flu is more likely to cause severe illness in pregnant women than in women of reproductive age who are not  pregnant. Changes in the immune system, heart, and lungs during pregnancy make pregnant women (and women up to two weeks postpartum) more prone to severe illness from flu, including illness resulting in hospitalization. Flu also may be harmful for a pregnant woman's developing baby. A common flu symptom is fever, which may  be associated with neural tube defects and other adverse outcomes for a developing baby. Getting vaccinated can also help protect a baby after birth from flu. (Mom passes antibodies onto the developing baby during her pregnancy.)  A Flu Vaccine is the Best Protection Against Flu Getting a flu vaccine is the first and most important step in protecting against flu. Pregnant women should get a flu shot and not the live attenuated influenza vaccine (LAIV), also known as nasal spray flu vaccine. Flu vaccines given during pregnancy help protect both the mother and her baby from flu. Vaccination has been shown to reduce the risk of flu-associated acute respiratory infection in pregnant women by up to one-half. A 2018 study showed that getting a flu shot reduced a pregnant woman's risk of being hospitalized with flu by an average of 40 percent. Pregnant women who get a flu vaccine are also helping to protect their babies from flu illness for the first several months after their birth, when they are too young to get vaccinated.   A Long Record of Safety for Flu Shots in Pregnant Women Flu shots have been given to millions of pregnant women over many years with a good safety record. There is a lot of evidence that flu vaccines can be given safely during pregnancy; though these data are limited for the first trimester. The CDC recommends that pregnant women get vaccinated during any trimester of their pregnancy. It is very important for pregnant women to get the flu shot.   Other Preventive Actions In addition to getting a flu shot, pregnant women should take the same everyday preventive actions the  CDC recommends of everyone, including covering coughs, washing hands often, and avoiding people who are sick.  Symptoms and Treatment If you get sick with flu symptoms call your doctor right away. There are antiviral drugs that can treat flu illness and prevent serious flu complications. The CDC recommends prompt treatment for people who have influenza infection or suspected influenza infection and who are at high risk of serious flu complications, such as people with asthma, diabetes (including gestational diabetes), or heart disease. Early treatment of influenza in hospitalized pregnant women has been shown to reduce the length of the hospital stay.  Symptoms Flu symptoms include fever, cough, sore throat, runny or stuffy nose, body aches, headache, chills and fatigue. Some people may also have vomiting and diarrhea. People may be infected with the flu and have respiratory symptoms without a fever.  Early Treatment is Important for Pregnant Women Treatment should begin as soon as possible because antiviral drugs work best when started early (within 48 hours after symptoms start). Antiviral drugs can make your flu illness milder and make you feel better faster. They may also prevent serious health problems that can result from flu illness. Oral oseltamivir (Tamiflu) is the preferred treatment for pregnant women because it has the most studies available to suggest that it is safe and beneficial. Antiviral drugs require a prescription from your provider. Having a fever caused by flu infection or other infections early in pregnancy may be linked to birth defects in a baby. In addition to taking antiviral drugs, pregnant women who get a fever should treat their fever with Tylenol (acetaminophen) and contact their provider immediately.  When to Seek Emergency Medical Care If you are pregnant and have any of these signs, seek care immediately:  Difficulty breathing or shortness of breath  Pain or  pressure in the chest or abdomen  Sudden dizziness  Confusion  Severe or persistent vomiting  High fever that is not responding to Tylenol (or store brand equivalent)  Decreased or no movement of your baby  [employer].pthttps://www.cdc.gov/flu/protect/vaccine/pregnant.htm

## 2016-12-06 ENCOUNTER — Other Ambulatory Visit: Payer: Medicaid Other

## 2016-12-06 ENCOUNTER — Encounter: Payer: Medicaid Other | Admitting: Obstetrics & Gynecology

## 2016-12-06 ENCOUNTER — Ambulatory Visit (INDEPENDENT_AMBULATORY_CARE_PROVIDER_SITE_OTHER): Payer: Medicaid Other

## 2016-12-06 DIAGNOSIS — Z3402 Encounter for supervision of normal first pregnancy, second trimester: Secondary | ICD-10-CM

## 2016-12-06 DIAGNOSIS — O3503X0 Maternal care for (suspected) central nervous system malformation or damage in fetus, choroid plexus cysts, not applicable or unspecified: Secondary | ICD-10-CM

## 2016-12-06 DIAGNOSIS — O350XX0 Maternal care for (suspected) central nervous system malformation in fetus, not applicable or unspecified: Secondary | ICD-10-CM | POA: Diagnosis not present

## 2016-12-06 DIAGNOSIS — Z3A31 31 weeks gestation of pregnancy: Secondary | ICD-10-CM

## 2016-12-06 NOTE — Progress Notes (Signed)
US 31+2 wks,cephalic,anterior pl gr 0,bilat adnexa's wnl,resolved choroid plexus cysts,afi 16.4 cm,fhr 137 bpm,efw 1705 g 45%

## 2016-12-07 LAB — HIV ANTIBODY (ROUTINE TESTING W REFLEX): HIV SCREEN 4TH GENERATION: NONREACTIVE

## 2016-12-07 LAB — CBC
HEMATOCRIT: 32.9 % — AB (ref 34.0–46.6)
HEMOGLOBIN: 10.6 g/dL — AB (ref 11.1–15.9)
MCH: 29.5 pg (ref 26.6–33.0)
MCHC: 32.2 g/dL (ref 31.5–35.7)
MCV: 92 fL (ref 79–97)
Platelets: 240 10*3/uL (ref 150–379)
RBC: 3.59 x10E6/uL — AB (ref 3.77–5.28)
RDW: 14.6 % (ref 12.3–15.4)
WBC: 9.2 10*3/uL (ref 3.4–10.8)

## 2016-12-07 LAB — GLUCOSE TOLERANCE, 2 HOURS W/ 1HR
GLUCOSE, 1 HOUR: 129 mg/dL (ref 65–179)
GLUCOSE, FASTING: 80 mg/dL (ref 65–91)
Glucose, 2 hour: 57 mg/dL — ABNORMAL LOW (ref 65–152)

## 2016-12-07 LAB — RPR: RPR: NONREACTIVE

## 2016-12-07 LAB — ANTIBODY SCREEN: Antibody Screen: NEGATIVE

## 2016-12-12 ENCOUNTER — Telehealth: Payer: Self-pay | Admitting: *Deleted

## 2016-12-12 NOTE — Telephone Encounter (Signed)
Pt called stating that she had lifted a heavy bench yesterday and today she is feeling increased pressure and pain. She states that she feels like the baby has dropped too. Advised pt that given it is her 4th pregnancy and she is in her 3rd trimester that it is possible that the baby could have dropped some, unrelated to her lifting the bench. She has no c/o vaginal bleeding, leaking of fluid, cramping or contractions. She states that baby is moving normally. Advised pt that if she felt that the pressure and pain were worse she could [facility] as we do not have any openings and are closed the next 2 days. Pt verbalized understanding.

## 2016-12-18 ENCOUNTER — Encounter: Payer: Medicaid Other | Admitting: Obstetrics & Gynecology

## 2016-12-19 NOTE — Progress Notes (Signed)
This encounter was created in error - please disregard.

## 2016-12-26 ENCOUNTER — Ambulatory Visit: Payer: Self-pay | Admitting: Family Medicine

## 2017-01-10 ENCOUNTER — Ambulatory Visit (INDEPENDENT_AMBULATORY_CARE_PROVIDER_SITE_OTHER): Payer: Medicaid Other | Admitting: Obstetrics & Gynecology

## 2017-01-10 ENCOUNTER — Other Ambulatory Visit: Payer: Self-pay

## 2017-01-10 ENCOUNTER — Encounter: Payer: Self-pay | Admitting: Obstetrics & Gynecology

## 2017-01-10 VITALS — BP 124/86 | HR 83 | Wt 154.0 lb

## 2017-01-10 DIAGNOSIS — Z3A36 36 weeks gestation of pregnancy: Secondary | ICD-10-CM

## 2017-01-10 DIAGNOSIS — Z331 Pregnant state, incidental: Secondary | ICD-10-CM

## 2017-01-10 DIAGNOSIS — Z1389 Encounter for screening for other disorder: Secondary | ICD-10-CM

## 2017-01-10 DIAGNOSIS — Z3483 Encounter for supervision of other normal pregnancy, third trimester: Secondary | ICD-10-CM

## 2017-01-10 LAB — POCT URINALYSIS DIPSTICK
Glucose, UA: NEGATIVE
Ketones, UA: NEGATIVE
LEUKOCYTES UA: NEGATIVE
Nitrite, UA: NEGATIVE
Protein, UA: NEGATIVE
RBC UA: NEGATIVE

## 2017-01-10 NOTE — Progress Notes (Signed)
[id number] 2275w2d Estimated Date of Delivery: 02/05/17  Blood pressure 124/86, pulse 83, weight 154 lb (69.9 kg), last menstrual period 05/07/2016.   BP weight and urine results all reviewed and noted.  Please refer to the obstetrical flow sheet for the fundal height and fetal heart rate documentation:  Patient reports good fetal movement, denies any bleeding and no rupture of membranes symptoms or regular contractions. Patient is without complaints. All questions were answered.  Orders Placed This Encounter  Procedures  . POCT urinalysis dipstick    Plan:  Continued routine obstetrical care,   Return in about 11 days (around 01/21/2017) for LROB.

## 2017-01-21 ENCOUNTER — Encounter: Payer: Self-pay | Admitting: Obstetrics & Gynecology

## 2017-01-21 ENCOUNTER — Ambulatory Visit (INDEPENDENT_AMBULATORY_CARE_PROVIDER_SITE_OTHER): Payer: Medicaid Other | Admitting: Obstetrics & Gynecology

## 2017-01-21 VITALS — BP 110/70 | HR 97 | Wt 157.0 lb

## 2017-01-21 DIAGNOSIS — Z3483 Encounter for supervision of other normal pregnancy, third trimester: Secondary | ICD-10-CM

## 2017-01-21 DIAGNOSIS — Z3A37 37 weeks gestation of pregnancy: Secondary | ICD-10-CM

## 2017-01-21 LAB — OB RESULTS CONSOLE GBS: GBS: NEGATIVE

## 2017-01-21 NOTE — Progress Notes (Signed)
[id number] 7256w6d Estimated Date of Delivery: 02/05/17  Blood pressure 110/70, pulse 97, weight 157 lb (71.2 kg), last menstrual period 05/07/2016.   BP weight and urine results all reviewed and noted.  Please refer to the obstetrical flow sheet for the fundal height and fetal heart rate documentation:  Patient reports good fetal movement, denies any bleeding and no rupture of membranes symptoms or regular contractions. Patient is without complaints. All questions were answered.  Orders Placed This Encounter  Procedures  . Strep Gp B NAA  . GC/Chlamydia Probe Amp    Plan:  Continued routine obstetrical care, GBS done cx 2.5/th/-3/soft/post  Return in about 1 week (around 01/28/2017) for LROB.

## 2017-01-22 NOTE — L&D Delivery Note (Signed)
Patient is 32 y.o. [id number] 7882w3d admitted for contractions. AROM at 0619.  Prenatal course also complicated by opioid abuse, started subutex in December .  Delivery Note At 6:53 AM a viable female was delivered via Vaginal, Spontaneous (Presentation: LOA  ).  APGAR: 9, 9; weight pending.   Placenta status: spontaneous, intact.  Cord: 3 vessel  Anesthesia: none  Episiotomy: None Lacerations: None Est. Blood Loss (mL): 100   Head delivered LOA. No nuchal cord present. Shoulder and body delivered in usual fashion. Infant with spontaneous cry, placed on mother's abdomen, dried and bulb suctioned. Cord clamped x 2 after 1-minute delay, and cut by family member. Cord blood drawn. Placenta delivered spontaneously with gentle cord traction. Fundus firm with massage and Pitocin. Perineum inspected and found to have no laceration.   Mom to postpartum.  Baby to Couplet care / Skin to Skin.  Oralia ManisSherin Abraham, DO PGY-1 02/01/2017, 7:25 AM  The above was performed under my direct supervision and guidance.

## 2017-01-23 LAB — STREP GP B NAA: Strep Gp B NAA: NEGATIVE

## 2017-01-23 LAB — GC/CHLAMYDIA PROBE AMP
Chlamydia trachomatis, NAA: NEGATIVE
Neisseria gonorrhoeae by PCR: NEGATIVE

## 2017-01-30 ENCOUNTER — Ambulatory Visit (INDEPENDENT_AMBULATORY_CARE_PROVIDER_SITE_OTHER): Payer: Medicaid Other | Admitting: Advanced Practice Midwife

## 2017-01-30 ENCOUNTER — Encounter: Payer: Self-pay | Admitting: Advanced Practice Midwife

## 2017-01-30 VITALS — BP 130/80 | HR 72 | Wt 157.5 lb

## 2017-01-30 DIAGNOSIS — O480 Post-term pregnancy: Secondary | ICD-10-CM

## 2017-01-30 DIAGNOSIS — Z3A39 39 weeks gestation of pregnancy: Secondary | ICD-10-CM | POA: Diagnosis not present

## 2017-01-30 DIAGNOSIS — Z3483 Encounter for supervision of other normal pregnancy, third trimester: Secondary | ICD-10-CM

## 2017-01-30 DIAGNOSIS — Z331 Pregnant state, incidental: Secondary | ICD-10-CM

## 2017-01-30 DIAGNOSIS — Z1389 Encounter for screening for other disorder: Secondary | ICD-10-CM

## 2017-01-30 LAB — POCT URINALYSIS DIPSTICK
Glucose, UA: NEGATIVE
KETONES UA: NEGATIVE
Leukocytes, UA: NEGATIVE
Nitrite, UA: NEGATIVE
Protein, UA: NEGATIVE
RBC UA: NEGATIVE

## 2017-01-30 NOTE — Patient Instructions (Signed)

## 2017-01-30 NOTE — Progress Notes (Signed)
[id number] 1770w1d Estimated Date of Delivery: 02/05/17  Blood pressure 130/80, pulse 72, weight 157 lb 8 oz (71.4 kg), last menstrual period 05/07/2016.   BP weight and urine results all reviewed and noted.  Please refer to the obstetrical flow sheet for the fundal height and fetal heart rate documentation:  Patient reports good fetal movement, denies any bleeding and no rupture of membranes symptoms or regular contractions. Patient is without complaints. All questions were answered.  Orders Placed This Encounter  Procedures  . US FETAL BPP WO NON STRESS  . POCT urinalysis dipstick    Plan:  Continued routine obstetrical care, membranes stripped  Return in about 1 week (around 02/06/2017) for LROB, US:BPP.

## 2017-02-01 ENCOUNTER — Inpatient Hospital Stay (HOSPITAL_COMMUNITY)
Admission: AD | Admit: 2017-02-01 | Discharge: 2017-02-03 | DRG: 807 | Disposition: A | Discharge status: Home / Self Care | Payer: Medicaid Other | Source: Ambulatory Visit | Attending: Obstetrics and Gynecology | Admitting: Obstetrics and Gynecology

## 2017-02-01 ENCOUNTER — Encounter (HOSPITAL_COMMUNITY): Payer: Self-pay | Admitting: Emergency Medicine

## 2017-02-01 DIAGNOSIS — O99324 Drug use complicating childbirth: Principal | ICD-10-CM | POA: Diagnosis present

## 2017-02-01 DIAGNOSIS — Z3A39 39 weeks gestation of pregnancy: Secondary | ICD-10-CM

## 2017-02-01 DIAGNOSIS — F17210 Nicotine dependence, cigarettes, uncomplicated: Secondary | ICD-10-CM | POA: Diagnosis present

## 2017-02-01 DIAGNOSIS — O99334 Smoking (tobacco) complicating childbirth: Secondary | ICD-10-CM | POA: Diagnosis present

## 2017-02-01 DIAGNOSIS — F1110 Opioid abuse, uncomplicated: Secondary | ICD-10-CM | POA: Diagnosis present

## 2017-02-01 DIAGNOSIS — Z3483 Encounter for supervision of other normal pregnancy, third trimester: Secondary | ICD-10-CM | POA: Diagnosis present

## 2017-02-01 LAB — TYPE AND SCREEN
ABO/RH(D): A POS
Antibody Screen: NEGATIVE

## 2017-02-01 LAB — CBC
HCT: 28.5 % — ABNORMAL LOW (ref 36.0–46.0)
Hemoglobin: 9.6 g/dL — ABNORMAL LOW (ref 12.0–15.0)
MCH: 29.1 pg (ref 26.0–34.0)
MCHC: 33.7 g/dL (ref 30.0–36.0)
MCV: 86.4 fL (ref 78.0–100.0)
Platelets: 179 10*3/uL (ref 150–400)
RBC: 3.3 MIL/uL — ABNORMAL LOW (ref 3.87–5.11)
RDW: 14.1 % (ref 11.5–15.5)
WBC: 15.2 10*3/uL — ABNORMAL HIGH (ref 4.0–10.5)

## 2017-02-01 LAB — ABO/RH: ABO/RH(D): A POS

## 2017-02-01 LAB — RPR: RPR: NONREACTIVE

## 2017-02-01 MED ORDER — COCONUT OIL OIL
1.0000 "application " | TOPICAL_OIL | Status: DC | PRN
  Filled 2017-02-01: qty 120

## 2017-02-01 MED ORDER — BUPRENORPHINE HCL 8 MG SL SUBL
8.0000 mg | SUBLINGUAL_TABLET | Freq: Every day | SUBLINGUAL | Status: DC
  Administered 2017-02-01 – 2017-02-03 (×3): 8 mg via SUBLINGUAL
  Filled 2017-02-01 (×3): qty 1

## 2017-02-01 MED ORDER — IBUPROFEN 600 MG PO TABS
600.0000 mg | ORAL_TABLET | Freq: Four times a day (QID) | ORAL | Status: DC
  Administered 2017-02-01 – 2017-02-03 (×9): 600 mg via ORAL
  Filled 2017-02-01 (×10): qty 1

## 2017-02-01 MED ORDER — OXYCODONE-ACETAMINOPHEN 5-325 MG PO TABS: 1.0000 | ORAL_TABLET | ORAL | Status: DC | PRN

## 2017-02-01 MED ORDER — DIBUCAINE 1 % RE OINT
1.0000 "application " | TOPICAL_OINTMENT | RECTAL | Status: DC | PRN
  Filled 2017-02-01: qty 28

## 2017-02-01 MED ORDER — TETANUS-DIPHTH-ACELL PERTUSSIS 5-2.5-18.5 LF-MCG/0.5 IM SUSP: 0.5000 mL | Freq: Once | INTRAMUSCULAR | Status: DC

## 2017-02-01 MED ORDER — PHENYLEPHRINE 40 MCG/ML (10ML) SYRINGE FOR IV PUSH (FOR BLOOD PRESSURE SUPPORT): 80.0000 ug | PREFILLED_SYRINGE | INTRAVENOUS | Status: DC | PRN

## 2017-02-01 MED ORDER — FENTANYL CITRATE (PF) 100 MCG/2ML IJ SOLN
100.0000 ug | INTRAMUSCULAR | Status: DC | PRN
  Administered 2017-02-01: 100 ug via INTRAVENOUS
  Filled 2017-02-01: qty 2

## 2017-02-01 MED ORDER — EPHEDRINE 5 MG/ML INJ: 10.0000 mg | INTRAVENOUS | Status: DC | PRN

## 2017-02-01 MED ORDER — OXYCODONE-ACETAMINOPHEN 5-325 MG PO TABS: 2.0000 | ORAL_TABLET | ORAL | Status: DC | PRN

## 2017-02-01 MED ORDER — OXYTOCIN BOLUS FROM INFUSION
500.0000 mL | Freq: Once | INTRAVENOUS | Status: AC
  Administered 2017-02-01: 500 mL via INTRAVENOUS

## 2017-02-01 MED ORDER — SENNOSIDES-DOCUSATE SODIUM 8.6-50 MG PO TABS
2.0000 | ORAL_TABLET | ORAL | Status: DC
  Administered 2017-02-01: 2 via ORAL
  Filled 2017-02-01: qty 2

## 2017-02-01 MED ORDER — LACTATED RINGERS IV SOLN
INTRAVENOUS | Status: DC
  Administered 2017-02-01: 06:00:00 via INTRAVENOUS

## 2017-02-01 MED ORDER — BUPRENORPHINE HCL 8 MG SL SUBL: 8.0000 mg | SUBLINGUAL_TABLET | Freq: Every day | SUBLINGUAL | Status: DC

## 2017-02-01 MED ORDER — DIPHENHYDRAMINE HCL 50 MG/ML IJ SOLN: 12.5000 mg | INTRAMUSCULAR | Status: DC | PRN

## 2017-02-01 MED ORDER — LIDOCAINE HCL (PF) 1 % IJ SOLN
30.0000 mL | INTRAMUSCULAR | Status: DC | PRN
  Filled 2017-02-01: qty 30

## 2017-02-01 MED ORDER — LACTATED RINGERS IV SOLN: 500.0000 mL | Freq: Once | INTRAVENOUS | Status: DC

## 2017-02-01 MED ORDER — ONDANSETRON HCL 4 MG/2ML IJ SOLN: 4.0000 mg | INTRAMUSCULAR | Status: DC | PRN

## 2017-02-01 MED ORDER — LACTATED RINGERS IV SOLN: 500.0000 mL | INTRAVENOUS | Status: DC | PRN

## 2017-02-01 MED ORDER — FENTANYL 2.5 MCG/ML BUPIVACAINE 1/10 % EPIDURAL INFUSION (WH - ANES): 14.0000 mL/h | INTRAMUSCULAR | Status: DC | PRN

## 2017-02-01 MED ORDER — SIMETHICONE 80 MG PO CHEW: 80.0000 mg | CHEWABLE_TABLET | ORAL | Status: DC | PRN

## 2017-02-01 MED ORDER — ONDANSETRON HCL 4 MG/2ML IJ SOLN
4.0000 mg | Freq: Four times a day (QID) | INTRAMUSCULAR | Status: DC | PRN
  Administered 2017-02-01: 4 mg via INTRAVENOUS
  Filled 2017-02-01: qty 2

## 2017-02-01 MED ORDER — OXYTOCIN 40 UNITS IN LACTATED RINGERS INFUSION - SIMPLE MED
2.5000 [IU]/h | INTRAVENOUS | Status: DC
  Filled 2017-02-01: qty 1000

## 2017-02-01 MED ORDER — SOD CITRATE-CITRIC ACID 500-334 MG/5ML PO SOLN: 30.0000 mL | ORAL | Status: DC | PRN

## 2017-02-01 MED ORDER — ONDANSETRON HCL 4 MG PO TABS: 4.0000 mg | ORAL_TABLET | ORAL | Status: DC | PRN

## 2017-02-01 MED ORDER — BENZOCAINE-MENTHOL 20-0.5 % EX AERO
1.0000 "application " | INHALATION_SPRAY | CUTANEOUS | Status: DC | PRN
  Administered 2017-02-01: 1 via TOPICAL
  Filled 2017-02-01 (×2): qty 56

## 2017-02-01 MED ORDER — WITCH HAZEL-GLYCERIN EX PADS: 1.0000 "application " | MEDICATED_PAD | CUTANEOUS | Status: DC | PRN

## 2017-02-01 MED ORDER — PRENATAL MULTIVITAMIN CH
1.0000 | ORAL_TABLET | Freq: Every day | ORAL | Status: DC
  Administered 2017-02-01: 1 via ORAL
  Filled 2017-02-01 (×2): qty 1

## 2017-02-01 MED ORDER — ACETAMINOPHEN 325 MG PO TABS: 650.0000 mg | ORAL_TABLET | ORAL | Status: DC | PRN

## 2017-02-01 NOTE — Lactation Note (Signed)
This note was copied from a baby's chart. Lactation Consultation Note  Patient Name: Stacy Long Today's Date: 02/01/2017 Reason for consult: Initial assessment;Term;Infant < 6lbs Breastfeeding consultation services and support information given to mom.  Mom states that she desires to both breast and formula feed.  Newborn is 6 hours old and has had no BF attempts and 2 formula feeds.  Mom is on subutex.  This is her first time breastfeeding a baby.  Instructed on feeding cues and encouraged her to call out for feeding assist.   Maternal Data Does the patient have breastfeeding experience prior to this delivery?: No  Feeding Feeding Type: Bottle Fed - Formula  LATCH Score                   Interventions    Lactation Tools Discussed/Used     Consult Status Consult Status: Follow-up Date: 02/02/17 Follow-up type: In-patient    Huston FoleyMOULDEN, Patte Winkel S 02/01/2017, 1:50 PM

## 2017-02-01 NOTE — Progress Notes (Signed)
AROM clear fluid.  FHR Cat 1. Ctx q 1-3 minutes.  cx 7/100/-1.  anticipate SVD soon

## 2017-02-01 NOTE — H&P (Signed)
Rossy Pincus BadderM Siedschlag is a 32 y.o. female [phone] with IUP at 5547w3d presenting for contractions. Pt states she has been having regular, every 5-6 minutes contractions, associated with none vaginal bleeding for several hours..  Membranes are intact, with active fetal movement.   [facility] from 13-26 wks  Prenatal History/Complications:  Opioid abuse, started Subutex in December  Past Medical History: Past Medical History:  Diagnosis Date  . Vaginal Pap smear, abnormal     Past Surgical History: Past Surgical History:  Procedure Laterality Date  . NO PAST SURGERIES      Obstetrical History: OB History    Gravida Para Term Preterm AB Living   4 3 3     3    SAB TAB Ectopic Multiple Live Births           3       Social History: Social History   Socioeconomic History  . Marital status: Single    Spouse name: None  . Number of children: None  . Years of education: None  . Highest education level: None  Social Needs  . Financial resource strain: None  . Food insecurity - worry: None  . Food insecurity - inability: None  . Transportation needs - medical: None  . Transportation needs - non-medical: None  Occupational History  . None  Tobacco Use  . Smoking status: Current Every Day Smoker    Packs/day: 1.00    Years: 13.00    Pack years: 13.00    Types: Cigarettes  . Smokeless tobacco: Never Used  Substance and Sexual Activity  . Alcohol use: No  . Drug use: No  . Sexual activity: Yes    Birth control/protection: None  Other Topics Concern  . None  Social History Narrative  . None    Family History: Family History  Problem Relation Age of Onset  . Other Paternal Grandfather        heart issues  . Diabetes Paternal Grandmother   . Dementia Maternal Grandfather   . Other Mother        back problems due to MVA  . Heart disease Mother   . Epilepsy Sister   . Seizures Daughter        18 months only 1 time  . Suicidality  Maternal Uncle     Allergies: Allergies  Allergen Reactions  . Codeine Hives and Nausea And Vomiting    Patient states that she can tolerate   . Pseudoeph-Doxylamine-Dm-Apap Hives    Nyquil    Medications Prior to Admission  Medication Sig Dispense Refill Last Dose  . buprenorphine (SUBUTEX) 8 MG SUBL SL tablet Place 8 mg under the tongue daily.   01/31/2017 at Unknown time  . Pediatric Multiple Vit-C-FA (FLINSTONES GUMMIES OMEGA-3 DHA) CHEW Chew by mouth. Takes 2 daily   01/31/2017 at Unknown time        Review of Systems   Constitutional: Negative for fever and chills Eyes: Negative for visual disturbances Respiratory: Negative for shortness of breath, dyspnea Cardiovascular: Negative for chest pain or palpitations  Gastrointestinal: Negative for vomiting, diarrhea and constipation.  POSITIVE for abdominal pain (contractions) Genitourinary: Negative for dysuria and urgency Musculoskeletal: Negative for back pain, joint pain, myalgias  Neurological: Negative for dizziness and headaches      Blood pressure 129/78, pulse 96, temperature 98.2 F (36.8 C), temperature source Oral, resp. rate 18, height 5\' 5"  (1.651 m), weight 71.7 kg (158 lb), last menstrual period 05/07/2016, SpO2  95 %. General appearance: alert, cooperative and no distress Lungs: clear to auscultation bilaterally Heart: regular rate and rhythm Abdomen: soft, non-tender; bowel sounds normal Extremities: Homans sign is negative, no sign of DVT DTR's 2+ Presentation: cephalic Fetal monitoring  Baseline: 145 bpm, Variability: Good {> 6 bpm), Accelerations: Reactive and Decelerations: Absent Uterine activity  5 Dilation: 5.5 Effacement (%): 70 Station: -2 Exam by:: Sandy Salaam, RN   [facility]  Initiated Care at  13wk  FOB Riki Sheer  Dating By 1st trimester U/S 11wk  Pap 08/06/16 LSIL-H w/ +HRHPV  Colpo:pp per JVF  GC/CT Initial: -/-               36+wks:  Genetic Screen NT/IT:  no appt since 13wks other than anatomy u/s  CF screen Declined  Anatomic [place] Female, bilateral CP cysts>repeat @ 28wks: RESOLVED  Flu vaccine Declined 11/29/16  Tdap Recommended ~ 28wks  Glucose Screen  2 hr080/129/51  GBS neg  Feed Preference breast  Contraception Depo or IUD  Circumcision Yes, at FT  Childbirth Classes declined  Pediatrician [facility]    *  Prenatal Transfer Tool  Maternal Diabetes: No Genetic Screening: Declined Maternal Ultrasounds/Referrals: Normal Fetal Ultrasounds or other Referrals:  None Maternal Substance Abuse:  Yes:  Type: Prescription drugs Significant Maternal Medications:  None Significant Maternal Lab Results: Lab values include: Group B Strep negative     No results found for this or any previous visit (from the past 24 hour(s)).  Assessment: Josefina SHANEIKA ROSSA is a 32 y.o. [phone] with an IUP at [redacted]w[redacted]d presenting for SOL  Plan: #Labor: expectant management #Pain:  Per request #FWB Cat 1   CRESENZO-DISHMAN,Demarian Epps 02/01/2017, 5:58 AM

## 2017-02-02 NOTE — Clinical Social Work Maternal (Signed)
CLINICAL SOCIAL WORK MATERNAL/CHILD NOTE  Patient Details  Name: Stacy Long MRN: [id number] Date of Birth: 06/28/1985  Date:  02/02/2017  Clinical Social Worker Initiating Note:  Nalee Lightle lcsw          Date/Time: Initiated:  02/02/17/         Child's Name:  Keiser   Biological Parents:  Mother   Need for Interpreter:  None   Reason for Referral:  Other (Comment)(MOB prescribed subutex)   Address:  [address] [place] [address]    Phone number:  [phone] (home)     Additional phone number:   Household Members/Support Persons (HM/SP):       HM/SP Name Relationship DOB or Age  HM/SP -1     HM/SP -2     HM/SP -3     HM/SP -4     HM/SP -5     HM/SP -6     HM/SP -7     HM/SP -8       Natural Supports (not living in the home): Extended Family, Immediate Family   Professional Supports:Therapist, Other (Comment)(attends group and individual [facility] and receives her subutex her as well)   Employment:Other (comment)(on leave from [employer])   Type of Work: [employer]   Education:  High school graduate   Homebound arranged:    Financial Resources:Medicaid   Other Resources: Food Stamps , WIC   Cultural/Religious Considerations Which May Impact Care:   Strengths: Home prepared for child , Ability to meet basic needs    Psychotropic Medications:         Pediatrician:       Pediatrician List:      [place]   [place]   [place]   [place]   [place]     Pediatrician Fax Number:    Risk Factors/Current Problems:     Cognitive State: Alert , Able to Concentrate    Mood/Affect: Comfortable , Calm    CSW Assessment: LCSW consulted due to MOB taking subutex.  MOB's newborn may go through withdrawals and will need to have NAS scores for safety.  LCSW met with MOB at bedside to assess for services.  MOB reported that she had  been attending [facility] in [place] for her subutex medication.  MOB reported that she also attended group at [facility] and had just attender her first individual therapy appointment last week.  MOB reported that she had been taking subutex 8 mg for about 10 months.  MOB reported that she had some dental work and had been given percocet stating she got energy from this drug so she kept on taking it.  MOB reported that when she found out she was pregnant she stopped the percocet and began taking the subutex. MOB reported that she had 3 other children at home ages 12, 10 and 9.  MOB reported that she had much support for her newborn stating FOB lived in the home and her mother and sister both lived close to her and were very supportive.  MOB reported that she had signed up for a parenting class called "parents as teachers" stating it had been a long time since she had a baby in the house.  MOB reported that she had all equipment needed to take newborn home, basinet, crib, swing and others stating her family has not had a baby in the family for awhile and everyone was excited so they all bought equipment.    LCSW explained possible withdrawals in her newborn from subutex and encouraged MOB to ask any questions or state any concerns to LCSW, RN and MD.  RN reported no concerns.  MD and RN to advise on any medical/withdrawal barriers to newborn's discharge.        CSW Plan/Description: CSW Will Continue to Monitor Umbilical Cord Tissue Drug Screen Results and Make Report if Warranted    Tonio Seider G Khoen Genet, LCSW 02/02/2017, 12:07 PM     

## 2017-02-02 NOTE — Progress Notes (Addendum)
POSTPARTUM PROGRESS NOTE  Post Partum Day 1  Subjective:  Stacy Long is a 32 y.o. [id number] s/p SVD at 6069w3d.  No acute events overnight.  Pt denies problems with ambulating, voiding or po intake.  She denies nausea or vomiting.  Pain is well controlled.  She has had flatus. She has not had bowel movement.  Lochia Minimal.   Objective: Blood pressure 106/60, pulse 70, temperature 98 F (36.7 C), temperature source Oral, resp. rate 18, height 5\' 5"  (1.651 m), weight 64.4 kg (142 lb), last menstrual period 05/07/2016, SpO2 99 %, unknown if currently breastfeeding.  Physical Exam:  General: alert, cooperative and no distress Chest: no respiratory distress Heart:regular rate, distal pulses intact Abdomen: soft, nontender,  Uterine Fundus: firm, appropriately tender DVT Evaluation: No calf swelling or tenderness Extremities: trace edema Skin: warm, dry  Recent Labs    02/01/17 0604  HGB 9.6*  HCT 28.5*    Assessment/Plan: Stacy Long is a 32 y.o. [id number] s/p SVD at 5169w3d   PPD#1 - Doing well, CTM Contraception: depo vs IUD Feeding: breast & bottle Dispo: Plan for discharge tomorrow.   LOS: 1 day   Alroy BailiffParker W Leland MD 02/02/2017, 7:13 AM   OB FELLOW POSTPARTUM PROGRESS NOTE ATTESTATION  I have seen and examined this patient and agree with above documentation in the resident's note.   Frederik PearJulie P Latausha Flamm, MD OB Fellow 9:55 AM

## 2017-02-03 NOTE — Discharge Instructions (Signed)

## 2017-02-03 NOTE — Discharge Summary (Signed)
OB Discharge Summary     Patient Name: Stacy GentileCristin M Devenport DOB: [birth year] MRN: [id number] Date of admission: 02/01/2017  Delivering MD: Oralia ManisABRAHAM, SHERIN )  Date of discharge: 02/03/2017    Admitting diagnosis: labor Intrauterine pregnancy: 6946w3d    Secondary diagnosis:  Active Problems:   Patient Active Problem List   Diagnosis Date Noted  . Narcotic abuse (HCC) 02/01/2017  . Vaginal delivery 02/01/2017  . Abnormal Pap smear of cervix 08/14/2016  . Trichomonas infection 08/10/2016  . Supervision of normal pregnancy 08/06/2016  . Smoker 08/06/2016    Additional problems: none     Discharge diagnosis: Term Pregnancy Delivered                                                                                                Complications: None  Hospital course:  Onset of Labor With Vaginal Delivery     32 y.o. yo [id number] at 7046w3d was admitted in Active Labor on 02/01/2017. Patient had an uncomplicated labor course as follows:  Membrane Rupture Time/Date: 6:19 AM ,02/01/2017   Intrapartum Procedures: Episiotomy: None [1]                                         Lacerations:  None [1]  Patient had a delivery of a Viable infant. 02/01/2017  Information for the patient's newborn:  Elder CyphersMabe, Boy Tashe [[id number]][[id number]]  Delivery Method: Vaginal, Spontaneous(Filed from Delivery Summary)    Pateint had an uncomplicated postpartum course.  She is ambulating, tolerating a regular diet, passing flatus, and urinating well. Patient is discharged home in stable condition on 02/03/17. Continue subutex per prescribing provider.   Physical exam  Vitals:   02/02/17 0540 02/02/17 1847  BP: 106/60 115/70  Pulse: 70 75  Resp: 18 18  Temp: 98 F (36.7 C) 98.7 F (37.1 C)  SpO2: 99%     General: alert, cooperative and no distress Lochia: appropriate Uterine Fundus: firm Incision: N/A DVT Evaluation: No evidence of DVT seen on physical exam.  Labs: No results found for this or any previous visit (from  the past 24 hour(s)).   Discharge instruction: per After Visit Summary and "Baby and Me Booklet".  After visit meds:  Allergies  Allergen Reactions  . Codeine Hives and Nausea And Vomiting    Patient states that she can tolerate   . Pseudoeph-Doxylamine-Dm-Apap Hives    Nyquil    Allergies as of 02/03/2017      Reactions   Codeine Hives, Nausea And Vomiting   Patient states that she can tolerate    Pseudoeph-doxylamine-dm-apap Hives   Nyquil      Medication List    STOP taking these medications   calcium carbonate 500 MG chewable tablet Commonly known as:  TUMS - dosed in mg elemental calcium     TAKE these medications   buprenorphine 8 MG Subl SL tablet Commonly known as:  SUBUTEX Place 8 mg under the tongue 2 (two) times daily.   FLINSTONES GUMMIES OMEGA-3 DHA Chew Chew  2 each by mouth daily. Takes 2 daily   ibuprofen 200 MG tablet Commonly known as:  ADVIL,MOTRIN Take 800 mg by mouth every 6 (six) hours as needed for moderate pain or cramping.        Diet: routine diet  Activity: Advance as tolerated. Pelvic rest for 6 weeks.   Outpatient follow up:4 weeks Future Appointments:  Future Appointments  Date Time Provider Department Center  02/07/2017  9:30 AM [facility] [place] [facility] None  02/07/2017 11:15 AM Cresenzo-Dishmon, Scarlette Calico, CNM [facility]    Follow up Appt: No Follow-up on file.     Postpartum contraception: IUD  Newborn Data: APGAR (1 MIN): 9   APGAR (5 MINS): 9     Baby Feeding: Breast Disposition:home with mother  Rolm Bookbinder, DO  02/03/2017

## 2017-02-07 ENCOUNTER — Encounter: Payer: Medicaid Other | Admitting: Advanced Practice Midwife

## 2017-02-07 ENCOUNTER — Other Ambulatory Visit: Payer: Medicaid Other

## 2017-02-12 ENCOUNTER — Telehealth: Payer: Self-pay | Admitting: *Deleted

## 2017-02-12 NOTE — Telephone Encounter (Signed)
Hcg 10.8. Pt not taking PNV but stated she would start back.

## 2017-03-04 ENCOUNTER — Ambulatory Visit: Payer: Medicaid Other | Admitting: Women's Health

## 2017-03-13 ENCOUNTER — Ambulatory Visit: Payer: Medicaid Other | Admitting: Women's Health

## 2017-03-13 ENCOUNTER — Encounter: Payer: Self-pay | Admitting: *Deleted

## 2017-07-23 DIAGNOSIS — H6982 Other specified disorders of Eustachian tube, left ear: Secondary | ICD-10-CM | POA: Diagnosis not present

## 2017-10-17 ENCOUNTER — Encounter: Payer: Self-pay | Admitting: Physician Assistant

## 2017-10-17 ENCOUNTER — Ambulatory Visit: Payer: Medicaid Other | Admitting: Physician Assistant

## 2017-10-17 VITALS — BP 106/71 | HR 86 | Temp 97.9°F | Ht 65.0 in | Wt 131.0 lb

## 2017-10-17 DIAGNOSIS — J209 Acute bronchitis, unspecified: Secondary | ICD-10-CM | POA: Diagnosis not present

## 2017-10-17 MED ORDER — BENZONATATE 200 MG PO CAPS: 200.0000 mg | ORAL_CAPSULE | Freq: Two times a day (BID) | ORAL | 0 refills | Status: DC | PRN

## 2017-10-17 MED ORDER — FLUCONAZOLE 150 MG PO TABS: 150.0000 mg | ORAL_TABLET | Freq: Once | ORAL | 0 refills | Status: AC

## 2017-10-17 MED ORDER — AMOXICILLIN-POT CLAVULANATE 875-125 MG PO TABS: 1.0000 | ORAL_TABLET | Freq: Two times a day (BID) | ORAL | 0 refills | Status: DC

## 2017-10-17 NOTE — Progress Notes (Signed)
BP 106/71   Pulse 86   Temp 97.9 F (36.6 C) (Oral)   Ht 5\' 5"  (1.651 m)   Wt 131 lb (59.4 kg)   BMI 21.80 kg/m    Subjective:    Patient ID: Stacy Long, female    DOB: [birth year], 32 y.o.   MRN: [id number]  HPI: Stacy Long is a 32 y.o. female presenting on 10/17/2017 for New Patient (Initial Visit)  Patient with several days of progressing upper respiratory and bronchial symptoms. Initially there was more upper respiratory congestion. This progressed to having significant cough that is productive throughout the day and severe at night. There is occasional wheezing after coughing. Sometimes there is slight dyspnea on exertion. It is productive mucus that is yellow in color. Denies any blood.   Past Medical History:  Diagnosis Date  . Vaginal Pap smear, abnormal    Relevant past medical, surgical, family and social history reviewed and updated as indicated. Interim medical history since our last visit reviewed. Allergies and medications reviewed and updated. DATA REVIEWED: CHART IN EPIC  Family History reviewed for pertinent findings.  Review of Systems  Constitutional: Positive for chills and fatigue. Negative for activity change and appetite change.  HENT: Positive for congestion, postnasal drip and sore throat.   Eyes: Negative.   Respiratory: Positive for cough and wheezing.   Cardiovascular: Negative.  Negative for chest pain, palpitations and leg swelling.  Gastrointestinal: Negative.   Genitourinary: Negative.   Musculoskeletal: Negative.   Skin: Negative.   Neurological: Positive for headaches.    Allergies as of 10/17/2017      Reactions   Codeine Hives, Nausea And Vomiting   Patient states that she can tolerate    Pseudoeph-doxylamine-dm-apap Hives   Nyquil      Medication List        Accurate as of 10/17/17 10:19 AM. Always use your most recent med list.          amoxicillin-clavulanate 875-125 MG tablet Commonly known as:  AUGMENTIN Take 1  tablet by mouth 2 (two) times daily.   benzonatate 200 MG capsule Commonly known as:  TESSALON Take 1 capsule (200 mg total) by mouth 2 (two) times daily as needed for cough.   buprenorphine 8 MG Subl SL tablet Commonly known as:  SUBUTEX Place 8 mg under the tongue 2 (two) times daily.   fluconazole 150 MG tablet Commonly known as:  DIFLUCAN Take 1 tablet (150 mg total) by mouth once for 1 dose.          Objective:    BP 106/71   Pulse 86   Temp 97.9 F (36.6 C) (Oral)   Ht 5\' 5"  (1.651 m)   Wt 131 lb (59.4 kg)   BMI 21.80 kg/m   Allergies  Allergen Reactions  . Codeine Hives and Nausea And Vomiting    Patient states that she can tolerate   . Pseudoeph-Doxylamine-Dm-Apap Hives    Nyquil    Wt Readings from Last 3 Encounters:  10/17/17 131 lb (59.4 kg)  02/03/17 140 lb 8 oz (63.7 kg)  01/30/17 157 lb 8 oz (71.4 kg)    Physical Exam  Constitutional: She is oriented to person, place, and time. She appears well-developed and well-nourished.  HENT:  Head: Normocephalic and atraumatic.  Right Ear: There is drainage and tenderness.  Left Ear: There is drainage and tenderness.  Nose: Mucosal edema and rhinorrhea present. Right sinus exhibits no maxillary sinus tenderness and no frontal sinus  tenderness. Left sinus exhibits no maxillary sinus tenderness and no frontal sinus tenderness.  Mouth/Throat: Oropharyngeal exudate and posterior oropharyngeal erythema present.  Eyes: Pupils are equal, round, and reactive to light. Conjunctivae and EOM are normal.  Neck: Normal range of motion. Neck supple.  Cardiovascular: Normal rate, regular rhythm, normal heart sounds and intact distal pulses.  Pulmonary/Chest: Effort normal. She has wheezes in the right upper field and the left upper field.  Abdominal: Soft. Bowel sounds are normal.  Neurological: She is alert and oriented to person, place, and time. She has normal reflexes.  Skin: Skin is warm and dry. No rash noted.    Psychiatric: She has a normal mood and affect. Her behavior is normal. Judgment and thought content normal.    Results for orders placed or performed during the hospital encounter of 02/01/17  OB RESULT CONSOLE Group B Strep  Result Value Ref Range   GBS Negative   CBC  Result Value Ref Range   WBC 15.2 (H) 4.0 - 10.5 K/uL   RBC 3.30 (L) 3.87 - 5.11 MIL/uL   Hemoglobin 9.6 (L) 12.0 - 15.0 g/dL   HCT 44.0 (L) 10.2 - 72.5 %   MCV 86.4 78.0 - 100.0 fL   MCH 29.1 26.0 - 34.0 pg   MCHC 33.7 30.0 - 36.0 g/dL   RDW 36.6 44.0 - 34.7 %   Platelets 179 150 - 400 K/uL  RPR  Result Value Ref Range   RPR Ser Ql Non Reactive Non Reactive  Type and screen [facility]  Result Value Ref Range   ABO/RH(D) A POS    Antibody Screen NEG    Sample Expiration 02/04/2017   ABO/Rh  Result Value Ref Range   ABO/RH(D) A POS       Assessment & Plan:   1. Acute bronchitis, unspecified organism - amoxicillin-clavulanate (AUGMENTIN) 875-125 MG tablet; Take 1 tablet by mouth 2 (two) times daily.  Dispense: 20 tablet; Refill: 0 - fluconazole (DIFLUCAN) 150 MG tablet; Take 1 tablet (150 mg total) by mouth once for 1 dose.  Dispense: 1 tablet; Refill: 0 - benzonatate (TESSALON) 200 MG capsule; Take 1 capsule (200 mg total) by mouth 2 (two) times daily as needed for cough.  Dispense: 20 capsule; Refill: 0   Continue all other maintenance medications as listed above.  Follow up plan: No follow-ups on file.  Educational handout given for survey  Remus Loffler PA-C [facility] [address]  [place], [place] [address] [phone]   10/17/2017, 10:19 AM

## 2017-12-09 ENCOUNTER — Ambulatory Visit: Payer: Medicaid Other

## 2018-03-10 DIAGNOSIS — J00 Acute nasopharyngitis [common cold]: Secondary | ICD-10-CM | POA: Diagnosis not present

## 2018-03-10 DIAGNOSIS — R070 Pain in throat: Secondary | ICD-10-CM | POA: Diagnosis not present

## 2018-03-12 DIAGNOSIS — Z1388 Encounter for screening for disorder due to exposure to contaminants: Secondary | ICD-10-CM | POA: Diagnosis not present

## 2018-03-12 DIAGNOSIS — Z3009 Encounter for other general counseling and advice on contraception: Secondary | ICD-10-CM | POA: Diagnosis not present

## 2018-03-12 DIAGNOSIS — Z0389 Encounter for observation for other suspected diseases and conditions ruled out: Secondary | ICD-10-CM | POA: Diagnosis not present

## 2018-04-23 ENCOUNTER — Ambulatory Visit (INDEPENDENT_AMBULATORY_CARE_PROVIDER_SITE_OTHER): Payer: Medicaid Other | Admitting: *Deleted

## 2018-04-23 ENCOUNTER — Ambulatory Visit: Payer: Medicaid Other | Admitting: Obstetrics and Gynecology

## 2018-04-23 ENCOUNTER — Other Ambulatory Visit: Payer: Self-pay

## 2018-04-23 ENCOUNTER — Encounter: Payer: Self-pay | Admitting: Obstetrics and Gynecology

## 2018-04-23 VITALS — BP 107/67 | HR 74 | Temp 98.5°F | Ht 65.0 in | Wt 137.0 lb

## 2018-04-23 DIAGNOSIS — Z3049 Encounter for surveillance of other contraceptives: Secondary | ICD-10-CM | POA: Diagnosis not present

## 2018-04-23 DIAGNOSIS — Z3042 Encounter for surveillance of injectable contraceptive: Secondary | ICD-10-CM

## 2018-04-23 DIAGNOSIS — Z3202 Encounter for pregnancy test, result negative: Secondary | ICD-10-CM | POA: Diagnosis not present

## 2018-04-23 LAB — POCT URINE PREGNANCY: Preg Test, Ur: NEGATIVE

## 2018-04-23 MED ORDER — MEDROXYPROGESTERONE ACETATE 150 MG/ML IM SUSP
150.0000 mg | Freq: Once | INTRAMUSCULAR | Status: AC
  Administered 2018-04-23: 150 mg via INTRAMUSCULAR

## 2018-04-23 MED ORDER — MEDROXYPROGESTERONE ACETATE 150 MG/ML IM SUSP: 150.0000 mg | INTRAMUSCULAR | 4 refills | Status: DC

## 2018-04-23 NOTE — Progress Notes (Signed)
Patient ID: Stacy Long, female   DOB: 03-22-1985, 33 y.o.   MRN: [id number]   [facility] Clinic Visit  @DATE @            Patient name: Stacy Long MRN [id number]  Date of birth: 1985/02/07  CC & HPI:  Stacy Long is a 33 y.o. female presenting today for discussion of BC Depo. Has had IUD May 2009 in June 2010 was taken out due to it getting stuck. Has tried OCP but often forgets. Is now taking suboxone 8 mg BID since Jan 2020. Has a cough due to bronchitis and is currently a smoker smoking 2 packs a day. Cigarette cessation importance reviewed. With pt. 3-5 mins. Offered chantix or nicoderm. Not interested. ROS:  ROS +chronic cough -fever  All systems are negative except as noted in the HPI and PMH.   Pertinent History Reviewed:   Reviewed: Medical         Past Medical History:  Diagnosis Date  . Vaginal Pap smear, abnormal                               Surgical Hx:    Past Surgical History:  Procedure Laterality Date  . NO PAST SURGERIES     Medications: Reviewed & Updated - see associated section                       Current Outpatient Medications:  .  buprenorphine-naloxone (SUBOXONE) 2-0.5 mg SUBL SL tablet, Place under the tongue. 2-8mg  BID, Disp: , Rfl:    Social History: Reviewed -  reports that she has been smoking cigarettes. She has a 13.00 pack-year smoking history. She has never used smokeless tobacco.  Objective Findings:  Vitals: Blood pressure 107/67, pulse 74, temperature 98.5 F (36.9 C), height 5\' 5"  (1.651 m), weight 137 lb (62.1 kg), last menstrual period 04/19/2018, not currently breastfeeding.  PHYSICAL EXAMINATION General appearance - alert, well appearing, and in no distress Mental status - alert, oriented to person, place, and time, normal mood, behavior, speech, dress, motor activity, and thought processes, affect appropriate to mood  PELVIC DISCUSSION ONLY  Assessment & Plan:   A:  1.  Contraception management 2        Cigarette use P:  1.  Rx Depo provera    By signing my name below, I, Arnette Norris, attest that this documentation has been prepared under the direction and in the presence of Tilda Burrow, MD. Electronically Signed: Arnette Norris Medical Scribe. 04/23/18. 8:48 AM.  I personally performed the services described in this documentation, which was SCRIBED in my presence. The recorded information has been reviewed and considered accurate. It has been edited as necessary during review. Tilda Burrow, MD

## 2018-04-23 NOTE — Progress Notes (Signed)
Depo Provera 150 mg given IM in left deltoid. Patient tolerated well. Next dose in 12 weeks.  

## 2018-06-12 DIAGNOSIS — E039 Hypothyroidism, unspecified: Secondary | ICD-10-CM | POA: Diagnosis not present

## 2018-07-16 ENCOUNTER — Ambulatory Visit: Payer: Medicaid Other

## 2018-07-17 ENCOUNTER — Other Ambulatory Visit: Payer: Self-pay

## 2018-07-17 ENCOUNTER — Ambulatory Visit (INDEPENDENT_AMBULATORY_CARE_PROVIDER_SITE_OTHER): Payer: Medicaid Other | Admitting: *Deleted

## 2018-07-17 DIAGNOSIS — Z3042 Encounter for surveillance of injectable contraceptive: Secondary | ICD-10-CM | POA: Diagnosis not present

## 2018-07-17 MED ORDER — MEDROXYPROGESTERONE ACETATE 150 MG/ML IM SUSP
150.0000 mg | Freq: Once | INTRAMUSCULAR | Status: AC
Start: 2018-07-17 — End: 2018-07-17
  Administered 2018-07-17: 150 mg via INTRAMUSCULAR

## 2018-07-17 NOTE — Progress Notes (Signed)
Depo Provera 150mg IM given in right deltoid with no complications. Pt to return in 12 weeks for next injection.  

## 2018-10-09 ENCOUNTER — Other Ambulatory Visit: Payer: Self-pay

## 2018-10-09 ENCOUNTER — Ambulatory Visit (INDEPENDENT_AMBULATORY_CARE_PROVIDER_SITE_OTHER): Payer: Medicaid Other | Admitting: *Deleted

## 2018-10-09 DIAGNOSIS — Z3042 Encounter for surveillance of injectable contraceptive: Secondary | ICD-10-CM

## 2018-10-09 MED ORDER — MEDROXYPROGESTERONE ACETATE 150 MG/ML IM SUSP
150.0000 mg | Freq: Once | INTRAMUSCULAR | Status: AC
  Administered 2018-10-09: 15:00:00 150 mg via INTRAMUSCULAR

## 2018-10-09 NOTE — Progress Notes (Signed)
Depo Provera 150 mg given IM in left deltoid. Pt tolerated well. Next dose in 12 weeks.

## 2018-12-25 ENCOUNTER — Ambulatory Visit (INDEPENDENT_AMBULATORY_CARE_PROVIDER_SITE_OTHER): Payer: Medicaid Other | Admitting: Family Medicine

## 2018-12-25 ENCOUNTER — Encounter: Payer: Self-pay | Admitting: Family Medicine

## 2018-12-25 DIAGNOSIS — R6883 Chills (without fever): Secondary | ICD-10-CM | POA: Diagnosis not present

## 2018-12-25 DIAGNOSIS — J029 Acute pharyngitis, unspecified: Secondary | ICD-10-CM | POA: Diagnosis not present

## 2018-12-25 DIAGNOSIS — H9202 Otalgia, left ear: Secondary | ICD-10-CM | POA: Diagnosis not present

## 2018-12-25 MED ORDER — AMOXICILLIN-POT CLAVULANATE 875-125 MG PO TABS: 1.0000 | ORAL_TABLET | Freq: Two times a day (BID) | ORAL | 0 refills | Status: AC

## 2018-12-25 NOTE — Progress Notes (Signed)
Virtual Visit via telephone Note Due to COVID-19 pandemic this visit was conducted virtually. This visit type was conducted due to national recommendations for restrictions regarding the COVID-19 Pandemic (e.g. social distancing, sheltering in place) in an effort to limit this patient's exposure and mitigate transmission in our community. All issues noted in this document were discussed and addressed.  A physical exam was not performed with this format.   I connected with Stacy Long on 12/25/2018 at 1035 by telephone and verified that I am speaking with the correct person using two identifiers. Stacy Long is currently located at home and family is currently with them during visit. The provider, Monia Pouch, FNP is located in their office at time of visit.  I discussed the limitations, risks, security and privacy concerns of performing an evaluation and management service by telephone and the availability of in person appointments. I also discussed with the patient that there may be a patient responsible charge related to this service. The patient expressed understanding and agreed to proceed.  Subjective:  Patient ID: Stacy Long, female    DOB: [birth year], 33 y.o.   MRN: [id number]  Chief Complaint:  Otalgia and Sore Throat   HPI: Stacy Long is a 33 y.o. female presenting on 12/25/2018 for Otalgia and Sore Throat   Otalgia  There is pain in the left ear. This is a new problem. The current episode started in the past 7 days. The problem occurs constantly. The problem has been gradually worsening. The pain is at a severity of 6/10. The pain is moderate. Associated symptoms include a sore throat. Pertinent negatives include no abdominal pain, coughing, diarrhea, ear discharge, headaches, hearing loss, neck pain, rash, rhinorrhea or vomiting. She has tried acetaminophen for the symptoms. The treatment provided no relief.  Sore Throat  This is a new problem. The current episode  started in the past 7 days. The problem has been gradually worsening. The pain is worse on the left side. The pain is at a severity of 6/10. The pain is moderate. Associated symptoms include ear pain, a plugged ear sensation and swollen glands. Pertinent negatives include no abdominal pain, congestion, coughing, diarrhea, drooling, ear discharge, headaches, hoarse voice, neck pain, shortness of breath, stridor, trouble swallowing or vomiting. She has tried acetaminophen for the symptoms. The treatment provided no relief.     Relevant past medical, surgical, family, and social history reviewed and updated as indicated.  Allergies and medications reviewed and updated.   Past Medical History:  Diagnosis Date  . Vaginal Pap smear, abnormal     Past Surgical History:  Procedure Laterality Date  . NO PAST SURGERIES      Social History   Socioeconomic History  . Marital status: Single    Spouse name: Not on file  . Number of children: Not on file  . Years of education: Not on file  . Highest education level: Not on file  Occupational History  . Not on file  Social Needs  . Financial resource strain: Not on file  . Food insecurity    Worry: Not on file    Inability: Not on file  . Transportation needs    Medical: Not on file    Non-medical: Not on file  Tobacco Use  . Smoking status: Current Every Day Smoker    Packs/day: 2.00    Years: 13.00    Pack years: 26.00    Types: Cigarettes  . Smokeless tobacco: Never Used  Substance and Sexual Activity  . Alcohol use: No  . Drug use: No  . Sexual activity: Yes    Birth control/protection: Injection  Lifestyle  . Physical activity    Days per week: Not on file    Minutes per session: Not on file  . Stress: Not on file  Relationships  . Social Musicianconnections    Talks on phone: Not on file    Gets together: Not on file    Attends religious service: Not on file    Active member of club or organization: Not on file    Attends  meetings of clubs or organizations: Not on file    Relationship status: Not on file  . Intimate partner violence    Fear of current or ex partner: Not on file    Emotionally abused: Not on file    Physically abused: Not on file    Forced sexual activity: Not on file  Other Topics Concern  . Not on file  Social History Narrative  . Not on file    Outpatient Encounter Medications as of 12/25/2018  Medication Sig  . amoxicillin-clavulanate (AUGMENTIN) 875-125 MG tablet Take 1 tablet by mouth 2 (two) times daily for 10 days.  . buprenorphine-naloxone (SUBOXONE) 2-0.5 mg SUBL SL tablet Place under the tongue. 2-8mg  BID  . medroxyPROGESTERone (DEPO-PROVERA) 150 MG/ML injection Inject 1 mL (150 mg total) into the muscle every 3 (three) months.   No facility-administered encounter medications on file as of 12/25/2018.     Allergies  Allergen Reactions  . Codeine Hives and Nausea And Vomiting    Patient states that she can tolerate   . Pseudoeph-Doxylamine-Dm-Apap Hives    Nyquil    Review of Systems  Constitutional: Positive for chills. Negative for activity change, appetite change, diaphoresis, fatigue, fever and unexpected weight change.  HENT: Positive for ear pain and sore throat. Negative for congestion, dental problem, drooling, ear discharge, facial swelling, hearing loss, hoarse voice, mouth sores, nosebleeds, postnasal drip, rhinorrhea, sinus pressure, sinus pain, sneezing, tinnitus, trouble swallowing and voice change.   Eyes: Negative.  Negative for photophobia and visual disturbance.  Respiratory: Negative for cough, chest tightness, shortness of breath and stridor.   Cardiovascular: Negative for chest pain, palpitations and leg swelling.  Gastrointestinal: Negative for abdominal pain, blood in stool, constipation, diarrhea, nausea and vomiting.  Endocrine: Negative.   Genitourinary: Negative for decreased urine volume, difficulty urinating, dysuria, frequency and urgency.   Musculoskeletal: Negative for arthralgias, myalgias and neck pain.  Skin: Negative.  Negative for rash.  Allergic/Immunologic: Negative.   Neurological: Negative for dizziness, weakness, light-headedness and headaches.  Hematological: Negative.   Psychiatric/Behavioral: Negative for confusion, hallucinations, sleep disturbance and suicidal ideas.  All other systems reviewed and are negative.        Observations/Objective: No vital signs or physical exam, this was a telephone or virtual health encounter.  Pt alert and oriented, answers all questions appropriately, and able to speak in full sentences.    Assessment and Plan: Nahomy was seen today for otalgia and sore throat.  Diagnoses and all orders for this visit:  Acute otalgia, left Sore throat Chills Reported symptoms consistent with acute otitis media. Will initiate below. Pt aware to continue symptomatic care at home. Avoid smoking and second hand smoke. Medications as prescribed. Report any new or worsening symptoms.  -     amoxicillin-clavulanate (AUGMENTIN) 875-125 MG tablet; Take 1 tablet by mouth 2 (two) times daily for 10 days.  Follow Up Instructions: Return if symptoms worsen or fail to improve.    I discussed the assessment and treatment plan with the patient. The patient was provided an opportunity to ask questions and all were answered. The patient agreed with the plan and demonstrated an understanding of the instructions.   The patient was advised to call back or seek an in-person evaluation if the symptoms worsen or if the condition fails to improve as anticipated.  The above assessment and management plan was discussed with the patient. The patient verbalized understanding of and has agreed to the management plan. Patient is aware to call the clinic if they develop any new symptoms or if symptoms persist or worsen. Patient is aware when to return to the clinic for a follow-up visit. Patient educated on  when it is appropriate to go to the emergency department.    I provided 15 minutes of non-face-to-face time during this encounter. The call started at 1035. The call ended at 1045. The other time was used for coordination of care.    Kari Baars, FNP-C [facility] [address] [place], [place] [address] [phone] 12/25/2018

## 2019-01-01 ENCOUNTER — Ambulatory Visit: Payer: Medicaid Other

## 2019-01-05 ENCOUNTER — Ambulatory Visit: Payer: Medicaid Other

## 2019-01-08 ENCOUNTER — Ambulatory Visit: Payer: Medicaid Other

## 2019-02-18 ENCOUNTER — Ambulatory Visit: Payer: Medicaid Other | Admitting: Family Medicine

## 2019-02-18 ENCOUNTER — Other Ambulatory Visit: Payer: Self-pay

## 2019-02-18 ENCOUNTER — Encounter: Payer: Self-pay | Admitting: Family Medicine

## 2019-02-18 VITALS — BP 105/72 | HR 68 | Temp 98.6°F | Ht 65.0 in | Wt 140.8 lb

## 2019-02-18 DIAGNOSIS — O9229 Other disorders of breast associated with pregnancy and the puerperium: Secondary | ICD-10-CM | POA: Diagnosis not present

## 2019-02-18 DIAGNOSIS — S20129A Blister (nonthermal) of breast, unspecified breast, initial encounter: Secondary | ICD-10-CM | POA: Diagnosis not present

## 2019-02-18 NOTE — Progress Notes (Signed)
Assessment & Plan:  1. Postpartum milk bleb - Discussed milk blisters/blebs and option of having it opened with a sterile needle. Patient did not wish to go this route since it does not bother her. Encouraged warm compresses throughout the day to see if she could get it to come out on its own.    Follow up plan: Return if symptoms worsen or fail to improve.  Deliah Boston, MSN, APRN, FNP-C [facility]  Subjective:   Patient ID: Stacy Long, female    DOB: Aug 08, 1985, 34 y.o.   MRN: [id number]  HPI: Stacy Long is a 34 y.o. female presenting on 02/18/2019 for Mass (on breast for 2 years)  Patient complains of a spot on the tip of her right nipple that has been present for the past 2 years since she was breast-feeding her son.  She only breast-fed for 2 to 3 weeks but this spot has been there ever since.  She noticed last night it was larger than usual and tried to squeeze it causing it to be sore today.  It did not cause any pain or discomfort prior to her trying to squeeze it last night.   ROS: Negative unless specifically indicated above in HPI.   Relevant past medical history reviewed and updated as indicated.   Allergies and medications reviewed and updated.   Current Outpatient Medications:  .  buprenorphine-naloxone (SUBOXONE) 2-0.5 mg SUBL SL tablet, Place under the tongue. 2-8mg  BID, Disp: , Rfl:   Allergies  Allergen Reactions  . Codeine Hives and Nausea And Vomiting    Patient states that she can tolerate   . Pseudoeph-Doxylamine-Dm-Apap Hives    Nyquil    Objective:   BP 105/72   Pulse 68   Temp 98.6 F (37 C) (Temporal)   Ht 5\' 5"  (1.651 m)   Wt 140 lb 12.8 oz (63.9 kg)   SpO2 100%   BMI 23.43 kg/m    Physical Exam Vitals reviewed.  Constitutional:      General: She is not in acute distress.    Appearance: Normal appearance. She is normal weight. She is not ill-appearing, toxic-appearing or diaphoretic.  HENT:     Head:  Normocephalic and atraumatic.  Eyes:     General: No scleral icterus.       Right eye: No discharge.        Left eye: No discharge.     Conjunctiva/sclera: Conjunctivae normal.  Cardiovascular:     Rate and Rhythm: Normal rate.  Pulmonary:     Effort: Pulmonary effort is normal. No respiratory distress.  Chest:     Comments: Milk blister to tip of right nipple. Nipple also has a very small red area where patient was squeezing which is the location of her soreness presently.  Musculoskeletal:        General: Normal range of motion.     Cervical back: Normal range of motion.  Skin:    General: Skin is warm and dry.     Capillary Refill: Capillary refill takes less than 2 seconds.  Neurological:     General: No focal deficit present.     Mental Status: She is alert and oriented to person, place, and time. Mental status is at baseline.  Psychiatric:        Mood and Affect: Mood normal.        Behavior: Behavior normal.        Thought Content: Thought content normal.  Judgment: Judgment normal.

## 2019-02-18 NOTE — Patient Instructions (Signed)
Nipple bleb or milk blister.

## 2019-02-20 ENCOUNTER — Other Ambulatory Visit: Payer: Self-pay | Admitting: Physician Assistant

## 2019-02-20 ENCOUNTER — Encounter: Payer: Self-pay | Admitting: Family Medicine

## 2019-02-20 NOTE — Telephone Encounter (Signed)
NA, VM full. Needs a televisit

## 2019-06-24 ENCOUNTER — Other Ambulatory Visit: Payer: Self-pay | Admitting: Obstetrics and Gynecology

## 2019-06-25 DIAGNOSIS — Z5181 Encounter for therapeutic drug level monitoring: Secondary | ICD-10-CM | POA: Diagnosis not present

## 2021-08-30 DIAGNOSIS — Z79899 Other long term (current) drug therapy: Secondary | ICD-10-CM | POA: Diagnosis not present

## 2021-09-27 DIAGNOSIS — Z79899 Other long term (current) drug therapy: Secondary | ICD-10-CM | POA: Diagnosis not present

## 2021-10-24 DIAGNOSIS — Z79899 Other long term (current) drug therapy: Secondary | ICD-10-CM | POA: Diagnosis not present

## 2021-11-07 DIAGNOSIS — Z79899 Other long term (current) drug therapy: Secondary | ICD-10-CM | POA: Diagnosis not present

## 2021-11-15 DIAGNOSIS — Z79899 Other long term (current) drug therapy: Secondary | ICD-10-CM | POA: Diagnosis not present

## 2021-11-21 DIAGNOSIS — Z79899 Other long term (current) drug therapy: Secondary | ICD-10-CM | POA: Diagnosis not present

## 2021-11-29 DIAGNOSIS — Z79899 Other long term (current) drug therapy: Secondary | ICD-10-CM | POA: Diagnosis not present

## 2021-12-05 DIAGNOSIS — Z79899 Other long term (current) drug therapy: Secondary | ICD-10-CM | POA: Diagnosis not present

## 2021-12-13 DIAGNOSIS — Z79891 Long term (current) use of opiate analgesic: Secondary | ICD-10-CM | POA: Diagnosis not present

## 2021-12-13 DIAGNOSIS — Z79899 Other long term (current) drug therapy: Secondary | ICD-10-CM | POA: Diagnosis not present

## 2021-12-20 DIAGNOSIS — Z79899 Other long term (current) drug therapy: Secondary | ICD-10-CM | POA: Diagnosis not present

## 2021-12-21 DIAGNOSIS — R238 Other skin changes: Secondary | ICD-10-CM | POA: Diagnosis not present

## 2021-12-21 DIAGNOSIS — M79601 Pain in right arm: Secondary | ICD-10-CM | POA: Diagnosis not present

## 2021-12-21 DIAGNOSIS — T148XXA Other injury of unspecified body region, initial encounter: Secondary | ICD-10-CM | POA: Diagnosis not present

## 2021-12-27 DIAGNOSIS — Z79899 Other long term (current) drug therapy: Secondary | ICD-10-CM | POA: Diagnosis not present

## 2022-01-02 DIAGNOSIS — Z79899 Other long term (current) drug therapy: Secondary | ICD-10-CM | POA: Diagnosis not present

## 2022-01-04 DIAGNOSIS — Z79899 Other long term (current) drug therapy: Secondary | ICD-10-CM | POA: Diagnosis not present

## 2022-01-10 DIAGNOSIS — Z79899 Other long term (current) drug therapy: Secondary | ICD-10-CM | POA: Diagnosis not present

## 2022-01-16 DIAGNOSIS — Z79899 Other long term (current) drug therapy: Secondary | ICD-10-CM | POA: Diagnosis not present

## 2022-01-24 DIAGNOSIS — Z79899 Other long term (current) drug therapy: Secondary | ICD-10-CM | POA: Diagnosis not present

## 2022-02-09 DIAGNOSIS — Z79899 Other long term (current) drug therapy: Secondary | ICD-10-CM | POA: Diagnosis not present

## 2022-02-21 ENCOUNTER — Other Ambulatory Visit (HOSPITAL_COMMUNITY)
Admission: RE | Admit: 2022-02-21 | Discharge: 2022-02-21 | Disposition: A | Discharge status: Home / Self Care | Payer: Medicaid Other | Source: Ambulatory Visit | Attending: Adult Health | Admitting: Adult Health

## 2022-02-21 ENCOUNTER — Ambulatory Visit (INDEPENDENT_AMBULATORY_CARE_PROVIDER_SITE_OTHER): Payer: Medicaid Other | Admitting: Adult Health

## 2022-02-21 ENCOUNTER — Encounter: Payer: Self-pay | Admitting: Adult Health

## 2022-02-21 VITALS — BP 112/76 | HR 87 | Ht 65.0 in | Wt 146.5 lb

## 2022-02-21 DIAGNOSIS — Z3202 Encounter for pregnancy test, result negative: Secondary | ICD-10-CM

## 2022-02-21 DIAGNOSIS — L918 Other hypertrophic disorders of the skin: Secondary | ICD-10-CM

## 2022-02-21 DIAGNOSIS — Z79899 Other long term (current) drug therapy: Secondary | ICD-10-CM | POA: Diagnosis not present

## 2022-02-21 DIAGNOSIS — Z0000 Encounter for general adult medical examination without abnormal findings: Secondary | ICD-10-CM | POA: Diagnosis not present

## 2022-02-21 DIAGNOSIS — Z01419 Encounter for gynecological examination (general) (routine) without abnormal findings: Secondary | ICD-10-CM | POA: Diagnosis not present

## 2022-02-21 DIAGNOSIS — Z30013 Encounter for initial prescription of injectable contraceptive: Secondary | ICD-10-CM

## 2022-02-21 LAB — POCT URINE PREGNANCY: Preg Test, Ur: NEGATIVE

## 2022-02-21 MED ORDER — MEDROXYPROGESTERONE ACETATE 150 MG/ML IM SUSP
150.0000 mg | Freq: Once | INTRAMUSCULAR | Status: AC
  Administered 2022-02-21: 150 mg via INTRAMUSCULAR

## 2022-02-21 MED ORDER — MEDROXYPROGESTERONE ACETATE 150 MG/ML IM SUSP: 150.0000 mg | INTRAMUSCULAR | 4 refills | Status: DC

## 2022-02-21 NOTE — Progress Notes (Signed)
Patient ID: Stacy Long, female   DOB: 1985-09-19, 37 y.o.   MRN: [id number] History of Present Illness: Stacy Long is a 37 year old white female,single, G4P4004 in for a well woman gyn exam  and pap. She wants to gt back on depo, has not had sex in long time.  PCP is Olen Pel.   Current Medications, Allergies, Past Medical History, Past Surgical History, Family History and Social History were reviewed in [employer] record.     Review of Systems: Patient denies any headaches, hearing loss, fatigue, blurred vision, shortness of breath, chest pain, abdominal pain, problems with bowel movements, urination, or intercourse.(Not active). No joint pain or mood swings.     Physical Exam:BP 112/76 (BP Location: Left Arm, Patient Position: Sitting, Cuff Size: Normal)   Pulse 87   Ht 5\' 5"  (1.651 m)   Wt 146 lb 8 oz (66.5 kg)   LMP 02/15/2022 (Approximate)   BMI 24.38 kg/m  UPT is negative. General:  Well developed, well nourished, no acute distress Skin:  Warm and dry Neck:  Midline trachea, normal thyroid, good ROM, no lymphadenopathy Lungs; Clear to auscultation bilaterally Breast:  No dominant palpable mass, retraction, or nipple discharge Cardiovascular: Regular rate and rhythm Abdomen:  Soft, non tender, no hepatosplenomegaly Pelvic:  External genitalia is normal in appearance,has brown skin tag right groin area and skin colored skin tag at base left labia. The vagina is normal in appearance, +blood in vault. Urethra has no lesions or masses. The cervix is bulbous. Pap with GC/CHL and HR HPV genotyping performed.  Uterus is felt to be normal size, shape, and contour.  No adnexal masses or tenderness noted.Bladder is non tender, no masses felt. Extremities/musculoskeletal:  No swelling or varicosities noted, no clubbing or cyanosis Psych:  No mood changes, alert and cooperative,seems happy AA is 1 Fall risk is low    02/21/2022    2:37 PM 02/18/2019    3:43 PM  10/17/2017    9:44 AM  Depression screen PHQ 2/9  Decreased Interest 3 0 0  Down, Depressed, Hopeless 0 0 0  PHQ - 2 Score 3 0 0  Altered sleeping 0    Tired, decreased energy 0    Change in appetite 0    Feeling bad or failure about yourself  0    Trouble concentrating 0    Moving slowly or fidgety/restless 0    Suicidal thoughts 0    PHQ-9 Score 3         02/21/2022    2:37 PM  GAD 7 : Generalized Anxiety Score  Nervous, Anxious, on Edge 0  Control/stop worrying 0  Worry too much - different things 0  Trouble relaxing 0  Restless 0  Easily annoyed or irritable 0  Afraid - awful might happen 0  Total GAD 7 Score 0      Upstream - 02/21/22 1443       Pregnancy Intention Screening   Does the patient want to become pregnant in the next year? No    Does the patient's partner want to become pregnant in the next year? No    Would the patient like to discuss contraceptive options today? No      Contraception Wrap Up   Current Method Abstinence    End Method Hormonal Injection    Contraception Counseling Provided Yes    How was the end contraceptive method provided? Prescription  Examination chaperoned by Levy Pupa LPN  Impression and Plan: 1. Routine general medical examination at a health care facility Pap sent Pap in 3 years if normal Physical in 1 year Labs with PCP  2. Pregnancy examination or test, negative result  3. Encounter for routine gynecological examination with Papanicolaou smear of cervix Pap sent  4. Encounter for initial prescription of injectable contraceptive Will give first injection depo in office now and rx sent for depo provera. Use condoms for 2 weeks  Meds ordered this encounter  Medications   medroxyPROGESTERone (DEPO-PROVERA) 150 MG/ML injection    Sig: Inject 1 mL (150 mg total) into the muscle every 3 (three) months.    Dispense:  1 mL    Refill:  4    Order Specific Question:   Supervising Provider    Answer:    Elonda Husky, LUTHER H [[id number]]    5. Skin tag Call if wants removed

## 2022-02-26 LAB — CYTOLOGY - PAP
Chlamydia: NEGATIVE
Comment: NEGATIVE
Comment: NEGATIVE
Comment: NORMAL
Diagnosis: NEGATIVE
High risk HPV: NEGATIVE
Neisseria Gonorrhea: NEGATIVE

## 2022-03-07 DIAGNOSIS — Z79899 Other long term (current) drug therapy: Secondary | ICD-10-CM | POA: Diagnosis not present

## 2022-05-02 DIAGNOSIS — F17210 Nicotine dependence, cigarettes, uncomplicated: Secondary | ICD-10-CM | POA: Diagnosis not present

## 2022-05-16 ENCOUNTER — Ambulatory Visit: Payer: Medicaid Other

## 2022-06-25 DIAGNOSIS — R21 Rash and other nonspecific skin eruption: Secondary | ICD-10-CM | POA: Diagnosis not present

## 2022-06-25 DIAGNOSIS — L299 Pruritus, unspecified: Secondary | ICD-10-CM | POA: Diagnosis not present

## 2022-06-25 DIAGNOSIS — T63791D Toxic effect of contact with other venomous plant, accidental (unintentional), subsequent encounter: Secondary | ICD-10-CM | POA: Diagnosis not present

## 2022-06-25 DIAGNOSIS — F17210 Nicotine dependence, cigarettes, uncomplicated: Secondary | ICD-10-CM | POA: Diagnosis not present

## 2023-02-22 DIAGNOSIS — Z79899 Other long term (current) drug therapy: Secondary | ICD-10-CM | POA: Diagnosis not present

## 2023-02-24 ENCOUNTER — Other Ambulatory Visit: Payer: Self-pay | Admitting: Adult Health

## 2023-03-01 DIAGNOSIS — Z79899 Other long term (current) drug therapy: Secondary | ICD-10-CM | POA: Diagnosis not present

## 2023-03-18 DIAGNOSIS — Z79899 Other long term (current) drug therapy: Secondary | ICD-10-CM | POA: Diagnosis not present

## 2023-04-05 ENCOUNTER — Ambulatory Visit: Payer: Self-pay | Admitting: Physician Assistant

## 2023-04-05 ENCOUNTER — Ambulatory Visit (INDEPENDENT_AMBULATORY_CARE_PROVIDER_SITE_OTHER)

## 2023-04-05 ENCOUNTER — Ambulatory Visit: Admitting: Family Medicine

## 2023-04-05 ENCOUNTER — Encounter: Payer: Self-pay | Admitting: Family Medicine

## 2023-04-05 VITALS — BP 116/77 | HR 113 | Temp 96.8°F | Ht 65.0 in | Wt 137.0 lb

## 2023-04-05 DIAGNOSIS — S91052A Open bite, left ankle, initial encounter: Secondary | ICD-10-CM

## 2023-04-05 DIAGNOSIS — Z23 Encounter for immunization: Secondary | ICD-10-CM | POA: Diagnosis not present

## 2023-04-05 DIAGNOSIS — W5501XA Bitten by cat, initial encounter: Secondary | ICD-10-CM

## 2023-04-05 DIAGNOSIS — M7989 Other specified soft tissue disorders: Secondary | ICD-10-CM | POA: Diagnosis not present

## 2023-04-05 MED ORDER — AMOXICILLIN-POT CLAVULANATE 875-125 MG PO TABS: 1.0000 | ORAL_TABLET | Freq: Two times a day (BID) | ORAL | 0 refills | Status: AC

## 2023-04-05 NOTE — Telephone Encounter (Signed)
 Chief Complaint: Cat bite Symptoms: painful, swelling Frequency: 2 days ago Pertinent Negatives: Patient denies n/a Disposition: [] ED /[] Urgent Care (no appt availability in office) / [x] Appointment(In office/virtual)/ []  [facility]/ [] Home Care/ [] Refused Recommended Disposition /[] [facility]/ []  Follow-up with PCP Additional Notes: Patient called to make appt for evaluation due to cat bite 2 days ago on inner left ankle. Patient reports ankle is now swollen and too painful to walk on. Patient reports cat has been treated for rabies. Appt made for today for evaluation.   Copied from CRM [id number]. Topic: Clinical - Red Word Triage >> Apr 05, 2023 12:54 PM Payton Doughty wrote: Red Word that prompted transfer to Nurse Triage: cat bite on Left foot/swollen red/hurts!! Reason for Disposition  [1] Puncture wound (hole through the skin) AND [2] from a cat bite (or deep claw puncture wound)  Answer Assessment - Initial Assessment Questions 1. ANIMAL: "What type of animal caused the bite?" "Is the injury from a bite or a claw?" If the animal is a dog or a cat, ask: "Was it a pet or a stray?" "Was it acting ill or behaving strangely?"     Cat bite - house pet 2. LOCATION: "Where is the bite located?"      Inside of left ankle 3. SIZE: "How big is the bite?" "What does it look like?"      Centimeter but punctured skin 4. ONSET: "When did the bite happen?" (Minutes or hours ago)      2 days 5. CIRCUMSTANCES: "Tell me how this happened."      Cat was playing with sandal 6. TETANUS: "When was your last tetanus booster?"     10-15 years 7. RABIES VACCINE: For dog or cat bites, ask: "Do you know if the pet is vaccinated against rabies?"  (e.g., yes, no, overdue for rabies shot, unknown)     Yes  Protocols used: Animal Bite-A-AH

## 2023-04-05 NOTE — Telephone Encounter (Signed)
 3rd attempt to reach pt, line rang continuously, no voicemail.

## 2023-04-05 NOTE — Telephone Encounter (Signed)
 Copied from CRM [id number]. Topic: Clinical - Red Word Triage >> Apr 05, 2023  8:54 AM Nyra Capes wrote: Red Word that prompted transfer to Nurse Triage: Dois Davenport mom calling in patient was bit by a cat on Wednesday night above her left ankle, it is now red and swollen hurts all way to the bone and can't put any weight on it.    Additional Notes: Patient's mother called in to advise that the patient was bitten by a cat 2 nights ago above her left ankle.  Mother states that she is at work and the patient is at home right now.  Mother gave this RN the phone number [phone] and advised this RN to call the patient in five minutes after she gives her a call to let her know that we would be calling. 1st attempt at calling patient--@ 9:07 am with no answer--rang many times and no voicemail came on

## 2023-04-05 NOTE — Telephone Encounter (Signed)
Called pt but no answer.

## 2023-04-05 NOTE — Progress Notes (Addendum)
 Subjective:  Patient ID: Stacy Long, female    DOB: [birth year], 38 y.o.   MRN: [id number]  Patient Care Team: Sonny Masters, FNP as PCP - General (Family Medicine)   Chief Complaint:  New Patient (Initial Visit) and Animal Bite (Left ankle bite yesterday morning )   HPI: Stacy Long is a 38 y.o. female presenting on 04/05/2023 for New Patient (Initial Visit) and Animal Bite (Left ankle bite yesterday morning )   Discussed the use of AI scribe software for clinical note transcription with the patient, who gave verbal consent to proceed.  History of Present Illness   Stacy Long is a 38 year old female who presents with an infected cat bite on her ankle. She is accompanied by her 24 year old grandmother, who is waiting in the car.  She was bitten by a friend's indoor cat on Thursday morning, April 04, 2023, between 1:00 and 2:00 AM. The cat has never been outside and is up to date on vaccinations, as confirmed by the friend. Initially, the bite did not cause significant pain or concern, but later that morning, after returning home and resting, she experienced difficulty getting up due to pain and redness at the site of the bite. The bite is located on her ankle.  The area of the bite has since become infected, characterized by redness and pain. She has been covering the wound with a bandage, although it has been difficult to keep it in place. She has not used peroxide on the wound, only soap and water. She mentions not having had a tetanus shot in a long time.  Her social history includes living with and caring for her grandmother. 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 She has a son who works at [employer], and she regularly picks him up for lunch. She has a history of visiting a Suboxone doctor but does not frequently visit other healthcare providers.          Relevant past medical, surgical, family, and social history reviewed and updated as  indicated.  Allergies and medications reviewed and updated. Data reviewed: Chart in Epic.   Past Medical History:  Diagnosis Date   Vaginal Pap smear, abnormal     Past Surgical History:  Procedure Laterality Date   NO PAST SURGERIES      Social History   Socioeconomic History   Marital status: Single    Spouse name: Not on file   Number of children: Not on file   Years of education: Not on file   Highest education level: Not on file  Occupational History   Not on file  Tobacco Use   Smoking status: Every Day    Current packs/day: 2.00    Average packs/day: 2.0 packs/day for 13.0 years (26.0 ttl pk-yrs)    Types: Cigarettes   Smokeless tobacco: Never  Vaping Use   Vaping status: Never Used  Substance and Sexual Activity   Alcohol use: Not Currently    Comment: rarely   Drug use: No   Sexual activity: Not Currently    Birth control/protection: None  Other Topics Concern   Not on file  Social History Narrative   Not on file   Social Drivers of Health   Financial Resource Strain: High Risk (02/21/2022)   Overall Financial Resource Strain (CARDIA)    Difficulty of Paying Living Expenses: Very hard  Food Insecurity: Food Insecurity Present (02/21/2022)   Hunger Vital  Sign    Worried About Programme researcher, broadcasting/film/video in the Last Year: Sometimes true    Ran Out of Food in the Last Year: Never true  Transportation Needs: No Transportation Needs (02/21/2022)   PRAPARE - Administrator, Civil Service (Medical): No    Lack of Transportation (Non-Medical): No  Physical Activity: Inactive (02/21/2022)   Exercise Vital Sign    Days of Exercise per Week: 0 days    Minutes of Exercise per Session: 0 min  Stress: No Stress Concern Present (02/21/2022)   [employer] of Occupational Health - Occupational Stress Questionnaire    Feeling of Stress : Not at all  Social Connections: Socially Isolated (02/21/2022)   Social Connection and Isolation Panel [NHANES]     Frequency of Communication with Friends and Family: More than three times a week    Frequency of Social Gatherings with Friends and Family: Twice a week    Attends Religious Services: Never    Database administrator or Organizations: No    Attends Banker Meetings: Never    Marital Status: Never married  Intimate Partner Violence: Not At Risk (02/21/2022)   Humiliation, Afraid, Rape, and Kick questionnaire    Fear of Current or Ex-Partner: No    Emotionally Abused: No    Physically Abused: No    Sexually Abused: No    Outpatient Encounter Medications as of 04/05/2023  Medication Sig   amoxicillin-clavulanate (AUGMENTIN) 875-125 MG tablet Take 1 tablet by mouth 2 (two) times daily.   buprenorphine-naloxone (SUBOXONE) 2-0.5 mg SUBL SL tablet Place under the tongue. 2-8mg  BID   IBUPROFEN PO Take by mouth.   medroxyPROGESTERone (DEPO-PROVERA) 150 MG/ML injection Inject 150 mg into the muscle every 3 (three) months.   [DISCONTINUED] medroxyPROGESTERone (DEPO-PROVERA) 150 MG/ML injection INJECT 1 ML (150 MG TOTAL) INTO THE MUSCLE EVERY 3 (THREE) MONTHS   No facility-administered encounter medications on file as of 04/05/2023.    Allergies  Allergen Reactions   Codeine Hives and Nausea And Vomiting    Patient states that she can tolerate    Pseudoeph-Doxylamine-Dm-Apap Hives    Nyquil    Pertinent ROS per HPI, otherwise unremarkable      Objective:  BP 116/77   Pulse (!) 113   Temp (!) 96.8 F (36 C)   Ht 5\' 5"  (1.651 m)   Wt 137 lb (62.1 kg)   SpO2 95%   BMI 22.80 kg/m    Wt Readings from Last 3 Encounters:  04/05/23 137 lb (62.1 kg)  02/21/22 146 lb 8 oz (66.5 kg)  02/18/19 140 lb 12.8 oz (63.9 kg)    Physical Exam Vitals and nursing note reviewed.  Constitutional:      General: She is not in acute distress.    Appearance: She is normal weight. She is not ill-appearing, toxic-appearing or diaphoretic.     Comments: Unkept   HENT:     Head:  Normocephalic and atraumatic.     Nose: Nose normal.     Mouth/Throat:     Mouth: Mucous membranes are moist.     Dentition: Abnormal dentition. Dental caries present.  Eyes:     Pupils: Pupils are equal, round, and reactive to light.  Cardiovascular:     Rate and Rhythm: Normal rate and regular rhythm.     Heart sounds: Normal heart sounds.  Pulmonary:     Effort: Pulmonary effort is normal.     Breath sounds: Normal breath sounds.  Musculoskeletal:     Cervical back: Neck supple.  Skin:    General: Skin is warm and dry.     Capillary Refill: Capillary refill takes less than 2 seconds.     Findings: Wound present.     Comments: Animal bite to left ankle, images below  Neurological:     General: No focal deficit present.     Mental Status: She is alert and oriented to person, place, and time.  Psychiatric:        Mood and Affect: Mood normal.        Behavior: Behavior normal. Behavior is cooperative.        Thought Content: Thought content normal.        Judgment: Judgment normal.       X-Ray: left ankle: No acute findings. Preliminary x-ray reading by Kari Baars, FNP-C, [facility].   Results for orders placed or performed in visit on 02/21/22  Cytology - PAP( [place])   Collection Time: 02/21/22  2:44 PM  Result Value Ref Range   High risk HPV Negative    Neisseria Gonorrhea Negative    Chlamydia Negative    Adequacy      Satisfactory for evaluation; transformation zone component PRESENT.   Diagnosis      - Negative for intraepithelial lesion or malignancy (NILM)   Comment Normal Reference Range HPV - Negative    Comment Normal Reference Ranger Chlamydia - Negative    Comment      Normal Reference Range Neisseria Gonorrhea - Negative  POCT urine pregnancy   Collection Time: 02/21/22  2:45 PM  Result Value Ref Range   Preg Test, Ur Negative Negative       Pertinent labs & imaging results that were available during my care of the patient were reviewed by me  and considered in my medical decision making.  Assessment & Plan:  Traniyah was seen today for new patient (initial visit) and animal bite.  Diagnoses and all orders for this visit:  Cat bite of ankle, left, initial encounter -     DG Ankle Complete Left -     amoxicillin-clavulanate (AUGMENTIN) 875-125 MG tablet; Take 1 tablet by mouth 2 (two) times daily. -     Td vaccine greater than or equal to 7yo preservative free IM  Need for tetanus booster -     Td vaccine greater than or equal to 7yo preservative free IM     Assessment and Plan    Infected Cat Bite Sustained a cat bite on the ankle on Thursday morning, now infected. The bite's proximity to the ankle joint raises concerns about potential complications. Reports redness and difficulty moving the foot, indicating significant infection. The cat is an indoor pet with up-to-date vaccinations, reducing rabies risk. Informed about infection risks and the importance of prompt treatment to prevent complications such as loss of function or severe infection. Discussed the need for antibiotics and tetanus booster, and the possibility of IV antibiotics if the infection does not improve. - Prescribe Augmentin for infection - Administer tetanus booster - Order x-ray of the ankle to assess for further complications - Instruct to clean the wound twice daily with soap and water, avoiding peroxide - Advise to keep the wound covered - Schedule follow-up appointment for wound reassessment on Monday or Tuesday - Request documentation of the cat's rabies vaccination status  General Health Maintenance Needs to establish care with a primary care provider. Acknowledges the importance of regular medical check-ups, especially with  aging. - Establish care with a primary care provider at the clinic          Continue all other maintenance medications.  Follow up plan: Return in about 5 days (around 04/10/2023), or if symptoms worsen or fail to  improve, for Wound Recheck.   Continue healthy lifestyle choices, including diet (rich in fruits, vegetables, and lean proteins, and low in salt and simple carbohydrates) and exercise (at least 30 minutes of moderate physical activity daily).  Educational handout given for animal bite  The above assessment and management plan was discussed with the patient. The patient verbalized understanding of and has agreed to the management plan. Patient is aware to call the clinic if they develop any new symptoms or if symptoms persist or worsen. Patient is aware when to return to the clinic for a follow-up visit. Patient educated on when it is appropriate to go to the emergency department.   Kari Baars, FNP-C [facility] [phone]

## 2023-04-09 ENCOUNTER — Ambulatory Visit: Admitting: Family Medicine

## 2023-04-12 ENCOUNTER — Encounter: Payer: Self-pay | Admitting: Family Medicine

## 2023-04-25 DIAGNOSIS — Z79899 Other long term (current) drug therapy: Secondary | ICD-10-CM | POA: Diagnosis not present

## 2023-05-02 DIAGNOSIS — Z79899 Other long term (current) drug therapy: Secondary | ICD-10-CM | POA: Diagnosis not present

## 2023-05-21 DIAGNOSIS — Z79899 Other long term (current) drug therapy: Secondary | ICD-10-CM | POA: Diagnosis not present

## 2023-05-23 ENCOUNTER — Ambulatory Visit: Admitting: Obstetrics & Gynecology

## 2023-06-04 DIAGNOSIS — Z79899 Other long term (current) drug therapy: Secondary | ICD-10-CM | POA: Diagnosis not present

## 2023-06-20 DIAGNOSIS — Z79899 Other long term (current) drug therapy: Secondary | ICD-10-CM | POA: Diagnosis not present

## 2023-06-26 DIAGNOSIS — Z79899 Other long term (current) drug therapy: Secondary | ICD-10-CM | POA: Diagnosis not present

## 2023-08-02 DIAGNOSIS — Z79899 Other long term (current) drug therapy: Secondary | ICD-10-CM | POA: Diagnosis not present

## 2023-08-21 DIAGNOSIS — Z5181 Encounter for therapeutic drug level monitoring: Secondary | ICD-10-CM | POA: Diagnosis not present

## 2023-08-28 DIAGNOSIS — Z79899 Other long term (current) drug therapy: Secondary | ICD-10-CM | POA: Diagnosis not present

## 2023-09-11 DIAGNOSIS — Z79899 Other long term (current) drug therapy: Secondary | ICD-10-CM | POA: Diagnosis not present

## 2023-09-24 DIAGNOSIS — Z79899 Other long term (current) drug therapy: Secondary | ICD-10-CM | POA: Diagnosis not present

## 2023-10-02 DIAGNOSIS — Z79899 Other long term (current) drug therapy: Secondary | ICD-10-CM | POA: Diagnosis not present

## 2023-10-14 DIAGNOSIS — Z79899 Other long term (current) drug therapy: Secondary | ICD-10-CM | POA: Diagnosis not present

## 2023-11-06 DIAGNOSIS — K529 Noninfective gastroenteritis and colitis, unspecified: Secondary | ICD-10-CM | POA: Diagnosis not present

## 2023-11-13 ENCOUNTER — Encounter: Payer: Self-pay | Admitting: Family Medicine

## 2023-11-13 ENCOUNTER — Ambulatory Visit: Admitting: Family Medicine
